# Patient Record
Sex: Male | Born: 1995 | Race: White | Hispanic: No | Marital: Single | Smoking: Never smoker
Health system: Southern US, Community
[De-identification: ages and names within clinical notes are randomized; demographics above are authoritative.]

## PROBLEM LIST (undated history)

## (undated) DIAGNOSIS — F1010 Alcohol abuse, uncomplicated: Secondary | ICD-10-CM

## (undated) HISTORY — PX: TONSILLECTOMY: SUR1361

## (undated) HISTORY — PX: DENTAL SURGERY: SHX609

---
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
## 2016-11-26 IMAGING — DX DG FINGER MIDDLE 2+V*R*
3 series · 3 of 3 positions shown · non-contrast
Comparison: None

CLINICAL DATA: Dropped weight on tip of right middle finger

EXAM:
RIGHT MIDDLE FINGER 2+V

[x finger pa right]
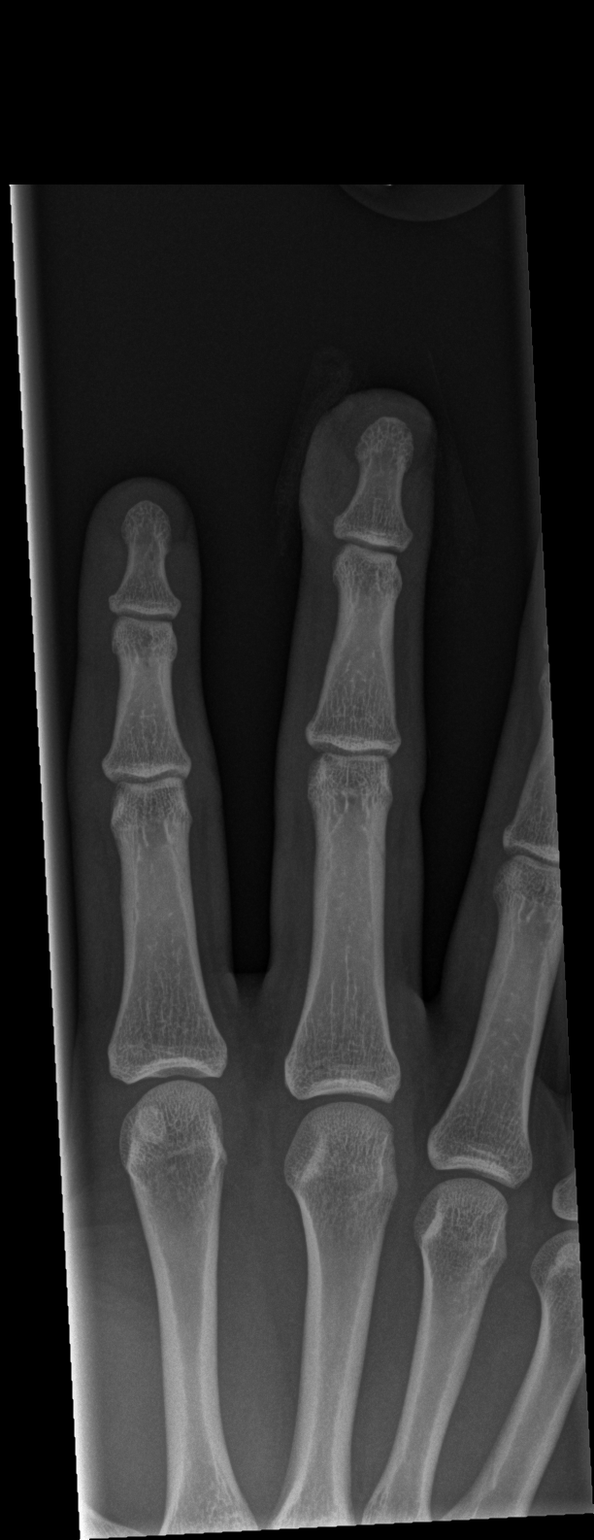

[x finger obl right]
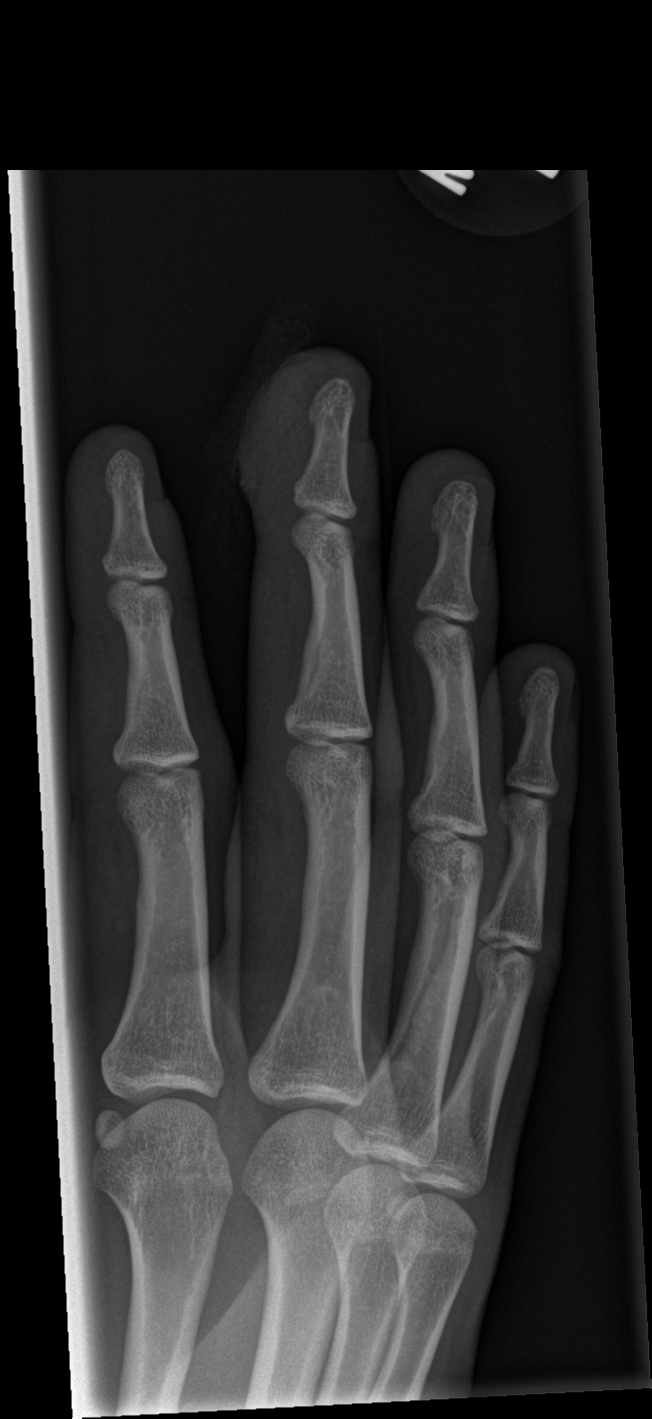

[x finger lat right]
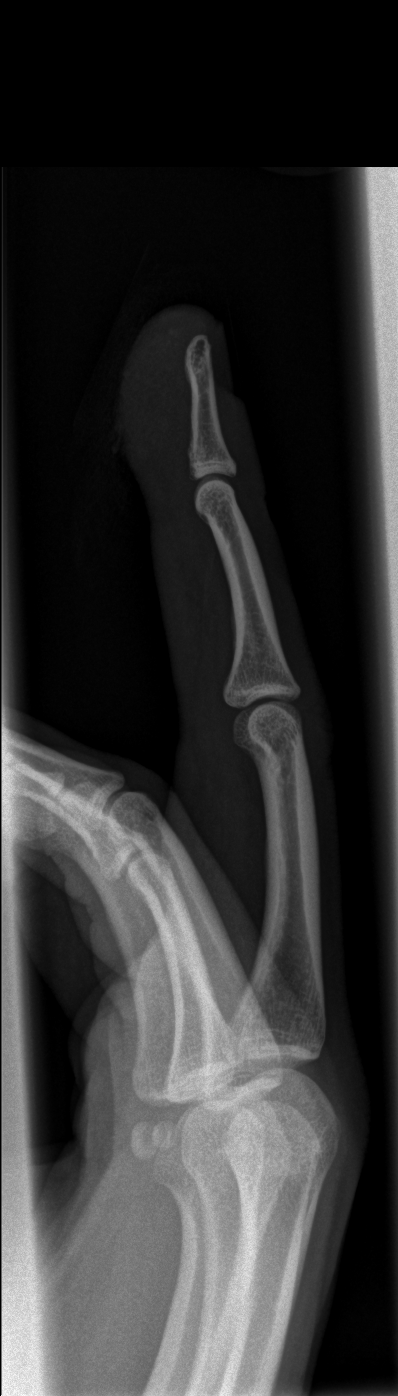

[3 of 3 positions shown; findings below may reference images not displayed]

FINDINGS: There is the soft tissue swelling overlying the distal phalanx. No
underlying fracture or subluxation identified no radiopaque foreign
bodies identified.
IMPRESSION: 1. Soft tissue swelling.
2. No acute bone abnormality.

## 2019-05-10 ENCOUNTER — Encounter (HOSPITAL_COMMUNITY): Payer: Self-pay | Admitting: *Deleted

## 2019-05-10 ENCOUNTER — Ambulatory Visit (HOSPITAL_COMMUNITY)
Admission: EM | Admit: 2019-05-10 | Discharge: 2019-05-10 | Disposition: A | Discharge status: Home / Self Care | Payer: 59 | Attending: Emergency Medicine | Admitting: Emergency Medicine

## 2019-05-10 ENCOUNTER — Other Ambulatory Visit: Payer: Self-pay

## 2019-05-10 DIAGNOSIS — R220 Localized swelling, mass and lump, head: Secondary | ICD-10-CM

## 2019-05-10 NOTE — ED Provider Notes (Signed)
[facility]    CSN: [id number] Arrival date & time: 05/10/19  1609      History   Chief Complaint Chief Complaint  Patient presents with  . Bumps on Tongue    HPI Mark Vargas is a 24 y.o. male.   Mark Vargas presents with complaints of concerns of bumps to the back of his tongue. Noted approximately 4 days ago. The following day the symptoms worsened but have not worsened since, have also not improved. Denies any previous similar. Has had some pain with swallowing although no difficulty with swallowing. Has had some congestion. Has noted that when he his clearing his nose he will occasionally spit out mucus that has some blood to it. No new foods or potential allergens he is aware of. No other rash elsewhere. No dental pain. He states he has had a male partner recently who maybe had some similar symptom, and whom he performed oral intercourse, inquiring if this could be STD.     ROS per HPI, negative if not otherwise mentioned.      History reviewed. No pertinent past medical history.  There are no problems to display for this patient.   Past Surgical History:  Procedure Laterality Date  . DENTAL SURGERY    . TONSILLECTOMY         Home Medications    Prior to Admission medications   Medication Sig Start Date End Date Taking? Authorizing Provider  cephALEXin (KEFLEX) 500 MG capsule Take 1 capsule (500 mg total) by mouth 4 (four) times daily. 03/29/15   Renfrow Lions, PA-C    Family History Family History  Problem Relation Age of Onset  . Healthy Mother   . Healthy Father     Social History Social History   Tobacco Use  . Smoking status: Never Smoker  . Smokeless tobacco: Never Used  Substance Use Topics  . Alcohol use: Yes    Comment: 3x weekly  . Drug use: Never     Allergies   Patient has no known allergies.   Review of Systems Review of Systems   Physical Exam Triage Vital Signs ED Triage Vitals  Enc  Vitals Group     BP 05/10/19 1635 (!) 157/63     Pulse Rate 05/10/19 1635 100     Resp 05/10/19 1635 18     Temp 05/10/19 1635 98.1 F (36.7 C)     Temp Source 05/10/19 1635 Oral     SpO2 05/10/19 1635 100 %     Weight --      Height --      Head Circumference --      Peak Flow --      Pain Score 05/10/19 1636 6     Pain Loc --      Pain Edu? --      Excl. in [place]? --    No data found.  Updated Vital Signs BP (!) 157/63   Pulse 100   Temp 98.1 F (36.7 C) (Oral)   Resp 18   SpO2 100%    Physical Exam Constitutional:      Appearance: He is well-developed.  HENT:     Head:     Jaw: No trismus.     Mouth/Throat:     Mouth: Mucous membranes are moist.     Dentition: Normal dentition. No dental tenderness.     Tongue: No lesions. Tongue does not deviate from midline.     Palate: No lesions.  Pharynx: Oropharynx is clear. No oropharyngeal exudate or uvula swelling.     Tonsils: 0 on the right. 0 on the left.     Comments: Distal right tongue is mildly swollen compared to left without visible abscess; no other soft palate redness or swelling; no lesions present; exam is somewhat limited due to patient tolerance / reflex  Cardiovascular:     Rate and Rhythm: Normal rate.  Pulmonary:     Effort: Pulmonary effort is normal.  Skin:    General: Skin is warm and dry.  Neurological:     Mental Status: He is alert and oriented to person, place, and time.      UC Treatments / Results  Labs (all labs ordered are listed, but only abnormal results are displayed) Labs Reviewed - No data to display  EKG   Radiology No results found.  Procedures Procedures (including critical care time)  Medications Ordered in UC Medications - No data to display  Initial Impression / Assessment and Plan / UC Course  I have reviewed the triage vital signs and the nursing notes.  Pertinent labs & imaging results that were available during my care of the patient were reviewed by me  and considered in my medical decision making (see chart for details).     No difficulty managing secretions or swallowing, no significant swelling. Afebrile. Has some congestion which at times can cause some blood with production of. Encouraged nasal saline to help promote moisture and prevent bleeding. No indication of epiglottitis. No indication of strep at this time. Offered oral decadron, patient would like to wait at this time. STD screening offered as well after discussion and patient will defer today. Return precautions provided. Patient verbalized understanding and agreeable to plan.   Final Clinical Impressions(s) / UC Diagnoses   Final diagnoses:  Mild tongue swelling     Discharge Instructions     I do not see any immediate red flag findings here today.  I think post nasal drip and your mild congestion is likely contributing to some of what you are experiencing.  You can try decongestants or antihistamines as needed to help with congestion.  Use of intranasal saline may help prevent any of the bleeding you occasional see.  If your symptoms do not improve or if they worsen- swelling, difficulty swallowing, fevers, pain, or new symptoms associated with this- please return to be seen for further evaluation.    ED Prescriptions    None     PDMP not reviewed this encounter.   Georgetta Haber, NP 05/10/19 [phone]

## 2019-05-10 NOTE — Discharge Instructions (Signed)
I do not see any immediate red flag findings here today.  I think post nasal drip and your mild congestion is likely contributing to some of what you are experiencing.  You can try decongestants or antihistamines as needed to help with congestion.  Use of intranasal saline may help prevent any of the bleeding you occasional see.  If your symptoms do not improve or if they worsen- swelling, difficulty swallowing, fevers, pain, or new symptoms associated with this- please return to be seen for further evaluation.

## 2019-05-10 NOTE — ED Triage Notes (Signed)
C/O painful bumps on back of tongue x 4 days.  Denies fevers or any other sxs.

## 2019-11-17 ENCOUNTER — Ambulatory Visit
Admission: EM | Admit: 2019-11-17 | Discharge: 2019-11-17 | Disposition: A | Discharge status: Home / Self Care | Payer: 59 | Attending: Physician Assistant | Admitting: Physician Assistant

## 2019-11-17 ENCOUNTER — Other Ambulatory Visit: Payer: Self-pay

## 2019-11-17 DIAGNOSIS — U071 COVID-19: Secondary | ICD-10-CM

## 2019-11-17 DIAGNOSIS — H66003 Acute suppurative otitis media without spontaneous rupture of ear drum, bilateral: Secondary | ICD-10-CM

## 2019-11-17 MED ORDER — AZELASTINE HCL 0.1 % NA SOLN: 2.0000 | Freq: Two times a day (BID) | NASAL | 0 refills | Status: DC

## 2019-11-17 MED ORDER — AMOXICILLIN-POT CLAVULANATE 875-125 MG PO TABS: 1.0000 | ORAL_TABLET | Freq: Two times a day (BID) | ORAL | 0 refills | Status: DC

## 2019-11-17 MED ORDER — BENZONATATE 200 MG PO CAPS: 200.0000 mg | ORAL_CAPSULE | Freq: Three times a day (TID) | ORAL | 0 refills | Status: DC

## 2019-11-17 NOTE — ED Provider Notes (Signed)
[facility]    CSN: [id number] Arrival date & time: 11/17/19  1643      History   Chief Complaint Chief Complaint  Patient presents with  . COVID + - cough    HPI Mark Vargas is a 24 y.o. male.   24 year old male comes in for 7 day of URI symptoms. Tested positive for COVID 4 days ago. Denies worsening of symptoms, new symptoms. Cough, nasal congestion, sore throat, loss of taste/smell, fatigue. Denies fever, chills, body aches. Denies abdominal pain, nausea, vomiting, diarrhea. Denies shortness of breath.      History reviewed. No pertinent past medical history.  There are no problems to display for this patient.   Past Surgical History:  Procedure Laterality Date  . DENTAL SURGERY    . TONSILLECTOMY         Home Medications    Prior to Admission medications   Medication Sig Start Date End Date Taking? Authorizing Provider  amoxicillin-clavulanate (AUGMENTIN) 875-125 MG tablet Take 1 tablet by mouth every 12 (twelve) hours. 11/17/19   Cathie Hoops, Pearson Picou V, PA-C  azelastine (ASTELIN) 0.1 % nasal spray Place 2 sprays into both nostrils 2 (two) times daily. 11/17/19   Cathie Hoops, Drew Herman V, PA-C  benzonatate (TESSALON) 200 MG capsule Take 1 capsule (200 mg total) by mouth every 8 (eight) hours. 11/17/19   Belinda Fisher, PA-C    Family History Family History  Problem Relation Age of Onset  . Healthy Mother   . Healthy Father     Social History Social History   Tobacco Use  . Smoking status: Never Smoker  . Smokeless tobacco: Never Used  Vaping Use  . Vaping Use: Never used  Substance Use Topics  . Alcohol use: Yes    Comment: 3x weekly  . Drug use: Never     Allergies   Patient has no known allergies.   Review of Systems Review of Systems  Reason unable to perform ROS: See HPI as above.     Physical Exam Triage Vital Signs ED Triage Vitals  Enc Vitals Group     BP 11/17/19 1742 136/89     Pulse Rate 11/17/19 1742 97     Resp 11/17/19 1742 18      Temp 11/17/19 1742 98.2 F (36.8 C)     Temp Source 11/17/19 1742 Oral     SpO2 11/17/19 1742 97 %     Weight --      Height --      Head Circumference --      Peak Flow --      Pain Score 11/17/19 1807 5     Pain Loc --      Pain Edu? --      Excl. in [place]? --    No data found.  Updated Vital Signs BP 136/89 (BP Location: Left Arm)   Pulse 97   Temp 98.2 F (36.8 C) (Oral)   Resp 18   SpO2 97%   Physical Exam Constitutional:      General: He is not in acute distress.    Appearance: He is well-developed. He is not ill-appearing, toxic-appearing or diaphoretic.  HENT:     Head: Normocephalic and atraumatic.     Right Ear: Ear canal and external ear normal. Tympanic membrane is erythematous and bulging.     Left Ear: Ear canal and external ear normal. Tympanic membrane is erythematous and bulging.     Nose:     Right  Sinus: No maxillary sinus tenderness or frontal sinus tenderness.     Left Sinus: No maxillary sinus tenderness or frontal sinus tenderness.     Mouth/Throat:     Mouth: Mucous membranes are moist.     Pharynx: Oropharynx is clear. Uvula midline.  Eyes:     Conjunctiva/sclera: Conjunctivae normal.     Pupils: Pupils are equal, round, and reactive to light.  Cardiovascular:     Rate and Rhythm: Normal rate and regular rhythm.  Pulmonary:     Effort: Pulmonary effort is normal. No accessory muscle usage, prolonged expiration, respiratory distress or retractions.     Breath sounds: No decreased air movement or transmitted upper airway sounds. No decreased breath sounds.     Comments: LCTAB Musculoskeletal:     Cervical back: Normal range of motion and neck supple.  Skin:    General: Skin is warm and dry.  Neurological:     Mental Status: He is alert and oriented to person, place, and time.    UC Treatments / Results  Labs (all labs ordered are listed, but only abnormal results are displayed) Labs Reviewed - No data to display  EKG   Radiology No  results found.  Procedures Procedures (including critical care time)  Medications Ordered in UC Medications - No data to display  Initial Impression / Assessment and Plan / UC Course  I have reviewed the triage vital signs and the nursing notes.  Pertinent labs & imaging results that were available during my care of the patient were reviewed by me and considered in my medical decision making (see chart for details).    No alarming signs on exam. Patient afebrile, nontoxic. Stable vitals. LCTAB. Bilateral TM erythematous, bulging, patient complaints of left ear pain, will cover for otitis media with augmentin. Discussed other symptomatic treatment. Return precautions given. Patient expresses understanding and agrees to plan.  Final Clinical Impressions(s) / UC Diagnoses   Final diagnoses:  COVID-19  Non-recurrent acute suppurative otitis media of both ears without spontaneous rupture of tympanic membranes    ED Prescriptions    Medication Sig Dispense Auth. Provider   amoxicillin-clavulanate (AUGMENTIN) 875-125 MG tablet Take 1 tablet by mouth every 12 (twelve) hours. 14 tablet Mieke Brinley V, PA-C   azelastine (ASTELIN) 0.1 % nasal spray Place 2 sprays into both nostrils 2 (two) times daily. 30 mL Jonavin Seder V, PA-C   benzonatate (TESSALON) 200 MG capsule Take 1 capsule (200 mg total) by mouth every 8 (eight) hours. 21 capsule Belinda Fisher, PA-C     PDMP not reviewed this encounter.   Belinda Fisher, PA-C 11/17/19 1853

## 2019-11-17 NOTE — Discharge Instructions (Addendum)
No alarming signs on exam. Your vitals were good, and your lungs were clear. Both ears were red and swollen. We will treat for ear infection with augmentin, which will also cover for a bacterial sinus infection. However, as discussed, these are all symptoms consistent with COVID and can continue for the next few days-weeks. Tessalon for cough. Azelastine nasal spray for congestion/drainage. If having shortness of breath, chest pain, go to the emergency department for further evaluation.

## 2019-11-17 NOTE — ED Triage Notes (Signed)
Pt c/o cough, congestion, sore throat, loss of taste/smell, and fatigue x7days. States covid positive 4 days ago. Denies SOB.

## 2019-12-27 ENCOUNTER — Other Ambulatory Visit: Payer: Self-pay

## 2019-12-27 ENCOUNTER — Ambulatory Visit: Admission: EM | Admit: 2019-12-27 | Discharge: 2019-12-27 | Discharge status: Against Medical Advice | Payer: 59

## 2019-12-31 ENCOUNTER — Other Ambulatory Visit: Payer: Self-pay

## 2019-12-31 ENCOUNTER — Ambulatory Visit
Admission: EM | Admit: 2019-12-31 | Discharge: 2019-12-31 | Disposition: A | Discharge status: Home / Self Care | Payer: 59 | Attending: Emergency Medicine | Admitting: Emergency Medicine

## 2019-12-31 DIAGNOSIS — R360 Urethral discharge without blood: Secondary | ICD-10-CM | POA: Insufficient documentation

## 2019-12-31 NOTE — Discharge Instructions (Addendum)

## 2019-12-31 NOTE — ED Triage Notes (Signed)
Pt states he has had peeling skin on his penis as well as a discharge for approximately 9 days. Pt states no painful urination or frequency. Pt is aox4 and ambulatory.

## 2019-12-31 NOTE — ED Provider Notes (Signed)
[facility]    CSN: [id number] Arrival date & time: 12/31/19  1603      History   Chief Complaint Chief Complaint  Patient presents with  . SEXUALLY TRANSMITTED DISEASE    sxs x 9 days, discharge    HPI Mark Vargas is a 24 y.o. male  Presenting for STI check.  Endorsing meatal irritation and penile discharge x9 days.  No painful urination, urinary frequency, testicular pain or swelling, abdominal pain, back pain, fever.  No nausea or vomiting.  History reviewed. No pertinent past medical history.  There are no problems to display for this patient.   Past Surgical History:  Procedure Laterality Date  . DENTAL SURGERY    . TONSILLECTOMY         Home Medications    Prior to Admission medications   Medication Sig Start Date End Date Taking? Authorizing Provider  azelastine (ASTELIN) 0.1 % nasal spray Place 2 sprays into both nostrils 2 (two) times daily. 11/17/19 12/31/19  Belinda Fisher, PA-C    Family History Family History  Problem Relation Age of Onset  . Healthy Mother   . Healthy Father     Social History Social History   Tobacco Use  . Smoking status: Never Smoker  . Smokeless tobacco: Never Used  Vaping Use  . Vaping Use: Never used  Substance Use Topics  . Alcohol use: Yes    Comment: 3x weekly  . Drug use: Never     Allergies   Patient has no known allergies.   Review of Systems As per HPI   Physical Exam Triage Vital Signs ED Triage Vitals  Enc Vitals Group     BP      Pulse      Resp      Temp      Temp src      SpO2      Weight      Height      Head Circumference      Peak Flow      Pain Score      Pain Loc      Pain Edu?      Excl. in [place]?    No data found.  Updated Vital Signs BP (!) 153/81 (BP Location: Right Arm)   Pulse 100   Temp 98.4 F (36.9 C) (Oral)   Resp 18   SpO2 99%   Visual Acuity Right Eye Distance:   Left Eye Distance:   Bilateral Distance:    Right Eye Near:   Left Eye  Near:    Bilateral Near:     Physical Exam Exam conducted with a chaperone present.  Constitutional:      General: He is not in acute distress. HENT:     Head: Normocephalic and atraumatic.  Eyes:     General: No scleral icterus.    Pupils: Pupils are equal, round, and reactive to light.  Cardiovascular:     Rate and Rhythm: Normal rate.  Pulmonary:     Effort: Pulmonary effort is normal. No respiratory distress.     Breath sounds: No wheezing.  Genitourinary:    Penis: Normal.      Testes: Normal.  Skin:    Coloration: Skin is not jaundiced or pale.  Neurological:     Mental Status: He is alert and oriented to person, place, and time.      UC Treatments / Results  Labs (all labs ordered are listed, but only abnormal  results are displayed) Labs Reviewed  CYTOLOGY, (ORAL, ANAL, URETHRAL) ANCILLARY ONLY    EKG   Radiology No results found.  Procedures Procedures (including critical care time)  Medications Ordered in UC Medications - No data to display  Initial Impression / Assessment and Plan / UC Course  I have reviewed the triage vital signs and the nursing notes.  Pertinent labs & imaging results that were available during my care of the patient were reviewed by me and considered in my medical decision making (see chart for details).     Cytology pending.  Patient wanting to wait until results are back prior to treatment.  Return precautions discussed, pt verbalized understanding and is agreeable to plan. Final Clinical Impressions(s) / UC Diagnoses   Final diagnoses:  Penile discharge, without blood     Discharge Instructions     Testing for chlamydia, gonorrhea, trichomonas is pending: please look for these results on the MyChart app/website.  We will notify you if you are positive and outline treatment at that time.  Important to avoid all forms of sexual intercourse (oral, vaginal, anal) with any/all partners for the next 7 days to avoid  spreading/reinfecting. Any/all sexual partners should be notified of testing/treatment today.  Return for persistent/worsening symptoms or if you develop fever, abdominal or pelvic pain, discharge, genital pain, blood in your urine, or are re-exposed to an STI.    ED Prescriptions    None     PDMP not reviewed this encounter.   Hall[place], [place], [place] 12/31/19 1658

## 2020-01-01 LAB — CYTOLOGY, (ORAL, ANAL, URETHRAL) ANCILLARY ONLY
Chlamydia: POSITIVE — AB
Comment: NEGATIVE
Comment: NEGATIVE
Comment: NORMAL
Neisseria Gonorrhea: NEGATIVE
Trichomonas: NEGATIVE

## 2020-01-02 ENCOUNTER — Telehealth (HOSPITAL_COMMUNITY): Payer: Self-pay | Admitting: Emergency Medicine

## 2020-01-02 MED ORDER — AZITHROMYCIN 250 MG PO TABS: 1000.0000 mg | ORAL_TABLET | Freq: Once | ORAL | 0 refills | Status: AC

## 2022-06-05 ENCOUNTER — Ambulatory Visit
Admission: RE | Admit: 2022-06-05 | Discharge: 2022-06-05 | Disposition: A | Discharge status: Home / Self Care | Payer: 59 | Source: Ambulatory Visit | Attending: Internal Medicine | Admitting: Internal Medicine

## 2022-06-05 VITALS — BP 152/90 | HR 94 | Temp 98.0°F | Resp 14

## 2022-06-05 DIAGNOSIS — B349 Viral infection, unspecified: Secondary | ICD-10-CM | POA: Diagnosis not present

## 2022-06-05 DIAGNOSIS — R197 Diarrhea, unspecified: Secondary | ICD-10-CM

## 2022-06-05 DIAGNOSIS — R112 Nausea with vomiting, unspecified: Secondary | ICD-10-CM

## 2022-06-05 MED ORDER — ONDANSETRON 4 MG PO TBDP: 4.0000 mg | ORAL_TABLET | Freq: Three times a day (TID) | ORAL | 0 refills | Status: DC | PRN

## 2022-06-05 NOTE — ED Triage Notes (Signed)
Pt presents with abdominal pain and emesis x 7 days.

## 2022-06-05 NOTE — ED Provider Notes (Signed)
[facility]    CSN: [id number] Arrival date & time: 06/05/22  1747      History   Chief Complaint Chief Complaint  Patient presents with   Abdominal Pain    Entered by patient   Emesis    HPI Mark Vargas is a 27 y.o. male.   Patient presents with nausea, vomiting, diarrhea that started about 7 days ago.  Patient denies blood in stool or emesis.  Patient is having approximately 3-4 episodes of vomiting a day.  Diarrhea is minimal.  Patient reports minimal abdominal pain on the left side that is intermittent.  Denies any associated fever, cough, upper respiratory symptoms.  Denies any known sick contacts, recent unfavorable foods, travel outside the [place].  Patient has been able to tolerate food and fluids.   Abdominal Pain Emesis   History reviewed. No pertinent past medical history.  There are no problems to display for this patient.   Past Surgical History:  Procedure Laterality Date   DENTAL SURGERY     TONSILLECTOMY         Home Medications    Prior to Admission medications   Medication Sig Start Date End Date Taking? Authorizing Provider  ondansetron (ZOFRAN-ODT) 4 MG disintegrating tablet Take 1 tablet (4 mg total) by mouth every 8 (eight) hours as needed for nausea or vomiting. 06/05/22  Yes Ondra Deboard, Hildred Alamin E, FNP  azelastine (ASTELIN) 0.1 % nasal spray Place 2 sprays into both nostrils 2 (two) times daily. 11/17/19 12/31/19  Ok Edwards, PA-C    Family History Family History  Problem Relation Age of Onset   Healthy Mother    Healthy Father     Social History Social History   Tobacco Use   Smoking status: Never   Smokeless tobacco: Never  Vaping Use   Vaping Use: Never used  Substance Use Topics   Alcohol use: Yes    Comment: 3x weekly   Drug use: Never     Allergies   Patient has no known allergies.   Review of Systems Review of Systems Per HPI  Physical Exam Triage Vital Signs ED Triage Vitals [06/05/22 1808]   Enc Vitals Group     BP (!) 152/90     Pulse Rate 94     Resp 14     Temp 98 F (36.7 C)     Temp Source Oral     SpO2 96 %     Weight      Height      Head Circumference      Peak Flow      Pain Score 2     Pain Loc      Pain Edu?      Excl. in [place]?    No data found.  Updated Vital Signs BP (!) 152/90 (BP Location: Left Arm)   Pulse 94   Temp 98 F (36.7 C) (Oral)   Resp 14   SpO2 96%   Visual Acuity Right Eye Distance:   Left Eye Distance:   Bilateral Distance:    Right Eye Near:   Left Eye Near:    Bilateral Near:     Physical Exam Constitutional:      General: He is not in acute distress.    Appearance: Normal appearance. He is not toxic-appearing or diaphoretic.  HENT:     Head: Normocephalic and atraumatic.     Mouth/Throat:     Mouth: Mucous membranes are moist.  Pharynx: No posterior oropharyngeal erythema.  Eyes:     Extraocular Movements: Extraocular movements intact.     Conjunctiva/sclera: Conjunctivae normal.  Cardiovascular:     Rate and Rhythm: Normal rate and regular rhythm.     Pulses: Normal pulses.     Heart sounds: Normal heart sounds.  Pulmonary:     Effort: Pulmonary effort is normal. No respiratory distress.     Breath sounds: Normal breath sounds.  Abdominal:     General: Bowel sounds are normal. There is no distension.     Palpations: Abdomen is soft.     Tenderness: There is no abdominal tenderness.  Neurological:     General: No focal deficit present.     Mental Status: He is alert and oriented to person, place, and time. Mental status is at baseline.  Psychiatric:        Mood and Affect: Mood normal.        Behavior: Behavior normal.        Thought Content: Thought content normal.        Judgment: Judgment normal.      UC Treatments / Results  Labs (all labs ordered are listed, but only abnormal results are displayed) Labs Reviewed  CBC  COMPREHENSIVE METABOLIC PANEL    EKG   Radiology No results  found.  Procedures Procedures (including critical care time)  Medications Ordered in UC Medications - No data to display  Initial Impression / Assessment and Plan / UC Course  I have reviewed the triage vital signs and the nursing notes.  Pertinent labs & imaging results that were available during my care of the patient were reviewed by me and considered in my medical decision making (see chart for details).     Suspect viral cause to patient's symptoms.  There are no signs of acute abdomen or dehydration on exam.  Although, given duration of symptoms did offer patient IV fluids but he declined this with shared decision making.  Will prescribe ondansetron to take as needed for nausea and advised patient to oral fluids.  Advised bland diet for diarrhea.  Will obtain CMP and CBC.  Patient advised to go to the ER if any symptoms persist or worsen.  Patient verbalized understanding and was agreeable with plan. Final Clinical Impressions(s) / UC Diagnoses   Final diagnoses:  Viral illness  Nausea vomiting and diarrhea     Discharge Instructions      I have prescribed you a nausea medication to take as needed.  Ensure adequate fluid hydration and bland diet.  Blood work is pending.  Will call if it is abnormal.  Follow-up with the ER if any symptoms persist or worsen.    ED Prescriptions     Medication Sig Dispense Auth. Provider   ondansetron (ZOFRAN-ODT) 4 MG disintegrating tablet Take 1 tablet (4 mg total) by mouth every 8 (eight) hours as needed for nausea or vomiting. 20 tablet Zeigler, Michele Rockers, [place]      PDMP not reviewed this encounter.   Teodora Medici, [place] 06/05/22 [phone]

## 2022-06-05 NOTE — Discharge Instructions (Signed)
I have prescribed you a nausea medication to take as needed.  Ensure adequate fluid hydration and bland diet.  Blood work is pending.  Will call if it is abnormal.  Follow-up with the ER if any symptoms persist or worsen.

## 2022-06-07 LAB — COMPREHENSIVE METABOLIC PANEL
ALT: 252 IU/L — ABNORMAL HIGH (ref 0–44)
AST: 161 IU/L — ABNORMAL HIGH (ref 0–40)
Albumin/Globulin Ratio: 1.7 (ref 1.2–2.2)
Albumin: 5 g/dL (ref 4.3–5.2)
Alkaline Phosphatase: 102 IU/L (ref 44–121)
BUN/Creatinine Ratio: 11 (ref 9–20)
BUN: 9 mg/dL (ref 6–20)
Bilirubin Total: 0.6 mg/dL (ref 0.0–1.2)
CO2: 21 mmol/L (ref 20–29)
Calcium: 10.1 mg/dL (ref 8.7–10.2)
Chloride: 99 mmol/L (ref 96–106)
Creatinine, Ser: 0.83 mg/dL (ref 0.76–1.27)
Globulin, Total: 2.9 g/dL (ref 1.5–4.5)
Glucose: 93 mg/dL (ref 70–99)
Potassium: 4 mmol/L (ref 3.5–5.2)
Sodium: 139 mmol/L (ref 134–144)
Total Protein: 7.9 g/dL (ref 6.0–8.5)
eGFR: 125 mL/min/{1.73_m2} (ref >59)

## 2022-06-07 LAB — CBC
Hematocrit: 50.2 % (ref 37.5–51.0)
Hemoglobin: 16.2 g/dL (ref 13.0–17.7)
MCH: 29.8 pg (ref 26.6–33.0)
MCHC: 32.3 g/dL (ref 31.5–35.7)
MCV: 92 fL (ref 79–97)
Platelets: 252 10*3/uL (ref 150–450)
RBC: 5.44 x10E6/uL (ref 4.14–5.80)
RDW: 12.4 % (ref 11.6–15.4)
WBC: 7.8 10*3/uL (ref 3.4–10.8)

## 2023-02-06 ENCOUNTER — Inpatient Hospital Stay (HOSPITAL_COMMUNITY)
Admission: EM | Admit: 2023-02-06 | Discharge: 2023-02-12 | DRG: 897 | Disposition: A | Discharge status: Home / Self Care | Payer: BC Managed Care – PPO | Attending: Internal Medicine | Admitting: Internal Medicine

## 2023-02-06 ENCOUNTER — Other Ambulatory Visit: Payer: Self-pay

## 2023-02-06 ENCOUNTER — Encounter (HOSPITAL_COMMUNITY): Payer: Self-pay

## 2023-02-06 DIAGNOSIS — F10129 Alcohol abuse with intoxication, unspecified: Secondary | ICD-10-CM | POA: Diagnosis present

## 2023-02-06 DIAGNOSIS — Z79899 Other long term (current) drug therapy: Secondary | ICD-10-CM

## 2023-02-06 DIAGNOSIS — F10139 Alcohol abuse with withdrawal, unspecified: Principal | ICD-10-CM | POA: Diagnosis present

## 2023-02-06 DIAGNOSIS — F1010 Alcohol abuse, uncomplicated: Secondary | ICD-10-CM | POA: Diagnosis present

## 2023-02-06 DIAGNOSIS — E876 Hypokalemia: Secondary | ICD-10-CM | POA: Diagnosis present

## 2023-02-06 DIAGNOSIS — Z6841 Body Mass Index (BMI) 40.0 and over, adult: Secondary | ICD-10-CM

## 2023-02-06 DIAGNOSIS — F10939 Alcohol use, unspecified with withdrawal, unspecified: Secondary | ICD-10-CM | POA: Diagnosis present

## 2023-02-06 DIAGNOSIS — F419 Anxiety disorder, unspecified: Secondary | ICD-10-CM | POA: Diagnosis present

## 2023-02-06 DIAGNOSIS — K7010 Alcoholic hepatitis without ascites: Secondary | ICD-10-CM | POA: Diagnosis present

## 2023-02-06 DIAGNOSIS — E8729 Other acidosis: Secondary | ICD-10-CM | POA: Diagnosis present

## 2023-02-06 DIAGNOSIS — D6959 Other secondary thrombocytopenia: Secondary | ICD-10-CM | POA: Diagnosis present

## 2023-02-06 DIAGNOSIS — F10930 Alcohol use, unspecified with withdrawal, uncomplicated: Principal | ICD-10-CM

## 2023-02-06 DIAGNOSIS — R7401 Elevation of levels of liver transaminase levels: Secondary | ICD-10-CM | POA: Diagnosis present

## 2023-02-06 DIAGNOSIS — E872 Acidosis, unspecified: Secondary | ICD-10-CM | POA: Diagnosis present

## 2023-02-06 DIAGNOSIS — D696 Thrombocytopenia, unspecified: Secondary | ICD-10-CM | POA: Diagnosis present

## 2023-02-06 DIAGNOSIS — Y902 Blood alcohol level of 40-59 mg/100 ml: Secondary | ICD-10-CM | POA: Diagnosis present

## 2023-02-06 HISTORY — DX: Alcohol abuse, uncomplicated: F10.10

## 2023-02-06 MED ORDER — THIAMINE HCL 100 MG/ML IJ SOLN: 100.0000 mg | Freq: Every day | INTRAMUSCULAR | Status: DC

## 2023-02-06 MED ORDER — LORAZEPAM 2 MG/ML IJ SOLN: 0.0000 mg | Freq: Two times a day (BID) | INTRAMUSCULAR | Status: DC

## 2023-02-06 MED ORDER — LORAZEPAM 1 MG PO TABS: 0.0000 mg | ORAL_TABLET | Freq: Four times a day (QID) | ORAL | Status: DC

## 2023-02-06 MED ORDER — LORAZEPAM 1 MG PO TABS: 0.0000 mg | ORAL_TABLET | Freq: Two times a day (BID) | ORAL | Status: DC

## 2023-02-06 MED ORDER — LORAZEPAM 2 MG/ML IJ SOLN
0.0000 mg | Freq: Four times a day (QID) | INTRAMUSCULAR | Status: DC
  Administered 2023-02-07: 2 mg via INTRAVENOUS
  Filled 2023-02-06 (×2): qty 1

## 2023-02-06 MED ORDER — THIAMINE MONONITRATE 100 MG PO TABS: 100.0000 mg | ORAL_TABLET | Freq: Every day | ORAL | Status: DC

## 2023-02-06 NOTE — ED Triage Notes (Signed)
Arrives [facility] from [facility] for alcohol detox. Visible tremors in triage.   Usually drinks 15 white claws a day but has had 3.   [facility] administered Librium 50mg  @ 6PM  Valium IM 10mg  @ 6PM, 9PM

## 2023-02-07 ENCOUNTER — Encounter (HOSPITAL_COMMUNITY): Payer: Self-pay | Admitting: Internal Medicine

## 2023-02-07 DIAGNOSIS — Z6841 Body Mass Index (BMI) 40.0 and over, adult: Secondary | ICD-10-CM | POA: Diagnosis not present

## 2023-02-07 DIAGNOSIS — D6959 Other secondary thrombocytopenia: Secondary | ICD-10-CM | POA: Diagnosis present

## 2023-02-07 DIAGNOSIS — F10939 Alcohol use, unspecified with withdrawal, unspecified: Secondary | ICD-10-CM | POA: Diagnosis present

## 2023-02-07 DIAGNOSIS — F10139 Alcohol abuse with withdrawal, unspecified: Secondary | ICD-10-CM | POA: Diagnosis present

## 2023-02-07 DIAGNOSIS — E876 Hypokalemia: Secondary | ICD-10-CM | POA: Diagnosis present

## 2023-02-07 DIAGNOSIS — D696 Thrombocytopenia, unspecified: Secondary | ICD-10-CM | POA: Diagnosis present

## 2023-02-07 DIAGNOSIS — K7010 Alcoholic hepatitis without ascites: Secondary | ICD-10-CM | POA: Diagnosis present

## 2023-02-07 DIAGNOSIS — F419 Anxiety disorder, unspecified: Secondary | ICD-10-CM | POA: Diagnosis present

## 2023-02-07 DIAGNOSIS — Y902 Blood alcohol level of 40-59 mg/100 ml: Secondary | ICD-10-CM | POA: Diagnosis present

## 2023-02-07 DIAGNOSIS — F10129 Alcohol abuse with intoxication, unspecified: Secondary | ICD-10-CM | POA: Diagnosis present

## 2023-02-07 DIAGNOSIS — Z79899 Other long term (current) drug therapy: Secondary | ICD-10-CM | POA: Diagnosis not present

## 2023-02-07 DIAGNOSIS — E8729 Other acidosis: Secondary | ICD-10-CM | POA: Diagnosis present

## 2023-02-07 DIAGNOSIS — F1010 Alcohol abuse, uncomplicated: Secondary | ICD-10-CM | POA: Diagnosis not present

## 2023-02-07 DIAGNOSIS — E872 Acidosis, unspecified: Secondary | ICD-10-CM | POA: Diagnosis present

## 2023-02-07 DIAGNOSIS — R7401 Elevation of levels of liver transaminase levels: Secondary | ICD-10-CM | POA: Diagnosis present

## 2023-02-07 LAB — CBC WITH DIFFERENTIAL/PLATELET
Abs Immature Granulocytes: 0.01 10*3/uL (ref 0.00–0.07)
Abs Immature Granulocytes: 0.02 10*3/uL (ref 0.00–0.07)
Basophils Absolute: 0.1 10*3/uL (ref 0.0–0.1)
Basophils Absolute: 0.1 10*3/uL (ref 0.0–0.1)
Basophils Relative: 1 %
Basophils Relative: 1 %
Eosinophils Absolute: 0.1 10*3/uL (ref 0.0–0.5)
Eosinophils Absolute: 0.1 10*3/uL (ref 0.0–0.5)
Eosinophils Relative: 2 %
Eosinophils Relative: 3 %
HCT: 43 % (ref 39.0–52.0)
HCT: 42 % (ref 39.0–52.0)
Hemoglobin: 15 g/dL (ref 13.0–17.0)
Hemoglobin: 14.3 g/dL (ref 13.0–17.0)
Immature Granulocytes: 0 %
Immature Granulocytes: 0 %
Lymphocytes Relative: 11 %
Lymphocytes Relative: 11 %
Lymphs Abs: 0.5 10*3/uL — ABNORMAL LOW (ref 0.7–4.0)
Lymphs Abs: 0.5 10*3/uL — ABNORMAL LOW (ref 0.7–4.0)
MCH: 31.4 pg (ref 26.0–34.0)
MCH: 31.4 pg (ref 26.0–34.0)
MCHC: 34.9 g/dL (ref 30.0–36.0)
MCHC: 34 g/dL (ref 30.0–36.0)
MCV: 90.1 fL (ref 80.0–100.0)
MCV: 92.1 fL (ref 80.0–100.0)
Monocytes Absolute: 0.7 10*3/uL (ref 0.1–1.0)
Monocytes Absolute: 0.7 10*3/uL (ref 0.1–1.0)
Monocytes Relative: 15 %
Monocytes Relative: 14 %
Neutro Abs: 3.2 10*3/uL (ref 1.7–7.7)
Neutro Abs: 3.4 10*3/uL (ref 1.7–7.7)
Neutrophils Relative %: 71 %
Neutrophils Relative %: 71 %
Platelets: 70 10*3/uL — ABNORMAL LOW (ref 150–400)
Platelets: 67 10*3/uL — ABNORMAL LOW (ref 150–400)
RBC: 4.77 MIL/uL (ref 4.22–5.81)
RBC: 4.56 MIL/uL (ref 4.22–5.81)
RDW: 12.3 % (ref 11.5–15.5)
RDW: 12.4 % (ref 11.5–15.5)
WBC: 4.5 10*3/uL (ref 4.0–10.5)
WBC: 4.8 10*3/uL (ref 4.0–10.5)
nRBC: 0 % (ref 0.0–0.2)
nRBC: 0 % (ref 0.0–0.2)

## 2023-02-07 LAB — COMPREHENSIVE METABOLIC PANEL
ALT: 331 U/L — ABNORMAL HIGH (ref 0–44)
ALT: 298 U/L — ABNORMAL HIGH (ref 0–44)
AST: 524 U/L — ABNORMAL HIGH (ref 15–41)
AST: 493 U/L — ABNORMAL HIGH (ref 15–41)
Albumin: 4.3 g/dL (ref 3.5–5.0)
Albumin: 4 g/dL (ref 3.5–5.0)
Alkaline Phosphatase: 108 U/L (ref 38–126)
Alkaline Phosphatase: 94 U/L (ref 38–126)
Anion gap: 19 — ABNORMAL HIGH (ref 5–15)
Anion gap: 10 (ref 5–15)
BUN: <5 mg/dL — ABNORMAL LOW (ref 6–20)
BUN: 5 mg/dL — ABNORMAL LOW (ref 6–20)
CO2: 21 mmol/L — ABNORMAL LOW (ref 22–32)
CO2: 25 mmol/L (ref 22–32)
Calcium: 9.9 mg/dL (ref 8.9–10.3)
Calcium: 9.8 mg/dL (ref 8.9–10.3)
Chloride: 96 mmol/L — ABNORMAL LOW (ref 98–111)
Chloride: 101 mmol/L (ref 98–111)
Creatinine, Ser: 0.63 mg/dL (ref 0.61–1.24)
Creatinine, Ser: 0.8 mg/dL (ref 0.61–1.24)
GFR, Estimated: >60 mL/min (ref >60)
GFR, Estimated: >60 mL/min (ref >60)
Glucose, Bld: 103 mg/dL — ABNORMAL HIGH (ref 70–99)
Glucose, Bld: 116 mg/dL — ABNORMAL HIGH (ref 70–99)
Potassium: 3.4 mmol/L — ABNORMAL LOW (ref 3.5–5.1)
Potassium: 3.5 mmol/L (ref 3.5–5.1)
Sodium: 136 mmol/L (ref 135–145)
Sodium: 136 mmol/L (ref 135–145)
Total Bilirubin: 1.8 mg/dL — ABNORMAL HIGH (ref <1.2)
Total Bilirubin: 2.1 mg/dL — ABNORMAL HIGH (ref <1.2)
Total Protein: 8.2 g/dL — ABNORMAL HIGH (ref 6.5–8.1)
Total Protein: 7.8 g/dL (ref 6.5–8.1)

## 2023-02-07 LAB — PROTIME-INR
INR: 1.1 (ref 0.8–1.2)
Prothrombin Time: 14 s (ref 11.4–15.2)

## 2023-02-07 LAB — PHOSPHORUS: Phosphorus: 3.3 mg/dL (ref 2.5–4.6)

## 2023-02-07 LAB — APTT: aPTT: 28 s (ref 24–36)

## 2023-02-07 LAB — BILIRUBIN, DIRECT: Bilirubin, Direct: 0.8 mg/dL — ABNORMAL HIGH (ref 0.0–0.2)

## 2023-02-07 LAB — LIPASE, BLOOD: Lipase: 162 U/L — ABNORMAL HIGH (ref 11–51)

## 2023-02-07 LAB — CK: Total CK: 190 U/L (ref 49–397)

## 2023-02-07 LAB — MAGNESIUM: Magnesium: 1.7 mg/dL (ref 1.7–2.4)

## 2023-02-07 LAB — ETHANOL: Alcohol, Ethyl (B): 40 mg/dL — ABNORMAL HIGH (ref <10)

## 2023-02-07 MED ORDER — ADULT MULTIVITAMIN W/MINERALS CH
1.0000 | ORAL_TABLET | Freq: Every day | ORAL | Status: DC
  Administered 2023-02-07 – 2023-02-12 (×6): 1 via ORAL
  Filled 2023-02-07 (×6): qty 1

## 2023-02-07 MED ORDER — LORAZEPAM 2 MG/ML IJ SOLN
INTRAMUSCULAR | Status: AC
  Administered 2023-02-07: 3 mg via INTRAVENOUS
  Filled 2023-02-07: qty 2

## 2023-02-07 MED ORDER — THIAMINE HCL 100 MG/ML IJ SOLN
100.0000 mg | Freq: Every day | INTRAMUSCULAR | Status: DC
  Filled 2023-02-07: qty 2

## 2023-02-07 MED ORDER — ACETAMINOPHEN 325 MG PO TABS: 650.0000 mg | ORAL_TABLET | Freq: Four times a day (QID) | ORAL | Status: DC | PRN

## 2023-02-07 MED ORDER — POTASSIUM CHLORIDE CRYS ER 20 MEQ PO TBCR
40.0000 meq | EXTENDED_RELEASE_TABLET | Freq: Once | ORAL | Status: DC
  Filled 2023-02-07: qty 2

## 2023-02-07 MED ORDER — THIAMINE MONONITRATE 100 MG PO TABS
100.0000 mg | ORAL_TABLET | Freq: Every day | ORAL | Status: DC
  Administered 2023-02-07 – 2023-02-12 (×6): 100 mg via ORAL
  Filled 2023-02-07 (×6): qty 1

## 2023-02-07 MED ORDER — ONDANSETRON HCL 4 MG/2ML IJ SOLN
4.0000 mg | Freq: Four times a day (QID) | INTRAMUSCULAR | Status: DC | PRN
  Administered 2023-02-09: 4 mg via INTRAVENOUS
  Filled 2023-02-07: qty 2

## 2023-02-07 MED ORDER — FOLIC ACID 1 MG PO TABS
1.0000 mg | ORAL_TABLET | Freq: Every day | ORAL | Status: DC
  Administered 2023-02-08 – 2023-02-12 (×5): 1 mg via ORAL
  Filled 2023-02-07 (×5): qty 1

## 2023-02-07 MED ORDER — LORAZEPAM 2 MG/ML IJ SOLN
1.0000 mg | INTRAMUSCULAR | Status: DC | PRN
  Administered 2023-02-07: 2 mg via INTRAVENOUS
  Administered 2023-02-07: 3 mg via INTRAVENOUS
  Administered 2023-02-07 (×2): 2 mg via INTRAVENOUS
  Administered 2023-02-07: 1 mg via INTRAVENOUS
  Administered 2023-02-07 – 2023-02-08 (×6): 2 mg via INTRAVENOUS
  Filled 2023-02-07 (×3): qty 1
  Filled 2023-02-07: qty 2
  Filled 2023-02-07 (×7): qty 1

## 2023-02-07 MED ORDER — LORAZEPAM 1 MG PO TABS: 1.0000 mg | ORAL_TABLET | ORAL | Status: DC | PRN

## 2023-02-07 MED ORDER — SODIUM CHLORIDE 0.9 % IV SOLN: INTRAVENOUS | Status: AC

## 2023-02-07 MED ORDER — POTASSIUM CHLORIDE 20 MEQ PO PACK
40.0000 meq | PACK | Freq: Once | ORAL | Status: AC
  Administered 2023-02-07: 40 meq via ORAL
  Filled 2023-02-07: qty 2

## 2023-02-07 MED ORDER — ACETAMINOPHEN 650 MG RE SUPP: 650.0000 mg | Freq: Four times a day (QID) | RECTAL | Status: DC | PRN

## 2023-02-07 MED ORDER — MELATONIN 3 MG PO TABS: 3.0000 mg | ORAL_TABLET | Freq: Every evening | ORAL | Status: DC | PRN

## 2023-02-07 NOTE — ED Notes (Signed)
Pts girlfriend at bedside.  Stated that the Pt was agitated earlier, but had since calmed down.  CIWA completed.  Ativan administered as the Pt's CIWA met criteria. Blankets and toilet  offered to the Pt.

## 2023-02-07 NOTE — ED Notes (Signed)
ED TO INPATIENT HANDOFF REPORT  ED Nurse Name and Phone #: Dahlia Client [phone]  S Name/Age/Gender Mark Vargas 27 y.o. male Room/Bed: 003C/003C  Code Status   Code Status: Full Code  Home/SNF/Other Rehab Patient oriented to: self, place, time, and situation Is this baseline? Yes   Triage Complete: Triage complete  Chief Complaint Alcohol withdrawal [facility]) [F10.939]  Triage Note Arrives [facility] from [facility] for alcohol detox. Visible tremors in triage.   Usually drinks 15 white claws a day but has had 3.   [facility] administered Librium 50mg  @ 6PM  Valium IM 10mg  @ 6PM, 9PM   Allergies Allergies  Allergen Reactions   Other Hives    Cats    Level of Care/Admitting Diagnosis ED Disposition     ED Disposition  Admit   Condition  --   Comment  Hospital Area: [facility] [[id number]]  Level of Care: Progressive [102]  Admit to Progressive based on following criteria: MULTISYSTEM THREATS such as stable sepsis, metabolic/electrolyte imbalance with or without encephalopathy that is responding to early treatment.  May admit patient to [facility] or [facility] if equivalent level of care is available:: No  Covid Evaluation: Asymptomatic - no recent exposure (last 10 days) testing not required  Diagnosis: Alcohol withdrawal (HCC) [291.81.ICD-9-CM]  Admitting Physician: Angie Fava [[id number]]  Attending Physician: Angie Fava [[id number]]  Certification:: I certify this patient will need inpatient services for at least 2 midnights  Expected Medical Readiness: 02/09/2023          B Medical/Surgery History Past Medical History:  Diagnosis Date   Alcohol abuse    Past Surgical History:  Procedure Laterality Date   DENTAL SURGERY     TONSILLECTOMY       A IV Location/Drains/Wounds Patient Lines/Drains/Airways Status     Active Line/Drains/Airways     Name Placement date Placement time Site Days   Peripheral IV 02/07/23 20  G Left;Posterior Hand 02/07/23  0002  Hand  less than 1            Intake/Output Last 24 hours No intake or output data in the 24 hours ending 02/07/23 2105  Labs/Imaging Results for orders placed or performed during the hospital encounter of 02/06/23 (from the past 48 hour(s))  Comprehensive metabolic panel     Status: Abnormal   Collection Time: 02/07/23 12:05 AM  Result Value Ref Range   Sodium 136 135 - 145 mmol/L   Potassium 3.4 (L) 3.5 - 5.1 mmol/L   Chloride 96 (L) 98 - 111 mmol/L   CO2 21 (L) 22 - 32 mmol/L   Glucose, Bld 103 (H) 70 - 99 mg/dL    Comment: Glucose reference range applies only to samples taken after fasting for at least 8 hours.   BUN <5 (L) 6 - 20 mg/dL    Comment: REPEATED TO VERIFY   Creatinine, Ser 0.63 0.61 - 1.24 mg/dL   Calcium 9.9 8.9 - 13.2 mg/dL   Total Protein 8.2 (H) 6.5 - 8.1 g/dL   Albumin 4.3 3.5 - 5.0 g/dL   AST 440 (H) 15 - 41 U/L   ALT 331 (H) 0 - 44 U/L   Alkaline Phosphatase 108 38 - 126 U/L   Total Bilirubin 1.8 (H) <1.2 mg/dL   GFR, Estimated >10 >27 mL/min    Comment: (NOTE) Calculated using the CKD-EPI Creatinine Equation (2021)    Anion gap 19 (H) 5 - 15    Comment: Performed at [facility], [address] [place], [place] [address]  Ethanol     Status: Abnormal   Collection Time: 02/07/23 12:05 AM  Result Value Ref Range   Alcohol, Ethyl (B) 40 (H) <10 mg/dL    Comment: (NOTE) Lowest detectable limit for serum alcohol is 10 mg/dL.  For medical purposes only. Performed at [facility] Lab, [address], [place], [place] [address]   CBC with Diff     Status: Abnormal   Collection Time: 02/07/23 12:05 AM  Result Value Ref Range   WBC 4.5 4.0 - 10.5 K/uL   RBC 4.77 4.22 - 5.81 MIL/uL   Hemoglobin 15.0 13.0 - 17.0 g/dL   HCT 40.1 02.7 - 25.3 %   MCV 90.1 80.0 - 100.0 fL   MCH 31.4 26.0 - 34.0 pg   MCHC 34.9 30.0 - 36.0 g/dL   RDW 66.4 40.3 - 47.4 %   Platelets 70 (L) 150 - 400 K/uL    Comment:  Immature Platelet Fraction may be clinically indicated, consider ordering this additional test [id number] REPEATED TO VERIFY PLATELET COUNT CONFIRMED BY SMEAR    nRBC 0.0 0.0 - 0.2 %   Neutrophils Relative % 71 %   Neutro Abs 3.2 1.7 - 7.7 K/uL   Lymphocytes Relative 11 %   Lymphs Abs 0.5 (L) 0.7 - 4.0 K/uL   Monocytes Relative 15 %   Monocytes Absolute 0.7 0.1 - 1.0 K/uL   Eosinophils Relative 2 %   Eosinophils Absolute 0.1 0.0 - 0.5 K/uL   Basophils Relative 1 %   Basophils Absolute 0.1 0.0 - 0.1 K/uL   Immature Granulocytes 0 %   Abs Immature Granulocytes 0.01 0.00 - 0.07 K/uL    Comment: Performed at [facility], [address] [place], [place] [address]  CBC with Differential/Platelet     Status: Abnormal   Collection Time: 02/07/23  8:51 AM  Result Value Ref Range   WBC 4.8 4.0 - 10.5 K/uL   RBC 4.56 4.22 - 5.81 MIL/uL   Hemoglobin 14.3 13.0 - 17.0 g/dL   HCT 29.5 18.8 - 41.6 %   MCV 92.1 80.0 - 100.0 fL   MCH 31.4 26.0 - 34.0 pg   MCHC 34.0 30.0 - 36.0 g/dL   RDW 60.6 30.1 - 60.1 %   Platelets 67 (L) 150 - 400 K/uL    Comment: Immature Platelet Fraction may be clinically indicated, consider ordering this additional test [id number] REPEATED TO VERIFY    nRBC 0.0 0.0 - 0.2 %   Neutrophils Relative % 71 %   Neutro Abs 3.4 1.7 - 7.7 K/uL   Lymphocytes Relative 11 %   Lymphs Abs 0.5 (L) 0.7 - 4.0 K/uL   Monocytes Relative 14 %   Monocytes Absolute 0.7 0.1 - 1.0 K/uL   Eosinophils Relative 3 %   Eosinophils Absolute 0.1 0.0 - 0.5 K/uL   Basophils Relative 1 %   Basophils Absolute 0.1 0.0 - 0.1 K/uL   Immature Granulocytes 0 %   Abs Immature Granulocytes 0.02 0.00 - 0.07 K/uL    Comment: Performed at [facility], [address], [place], [place] [address]  Comprehensive metabolic panel     Status: Abnormal   Collection Time: 02/07/23  8:51 AM  Result Value Ref Range   Sodium 136 135 - 145 mmol/L   Potassium 3.5 3.5 - 5.1 mmol/L   Chloride 101 98 -  111 mmol/L   CO2 25 22 -  32 mmol/L   Glucose, Bld 116 (H) 70 - 99 mg/dL    Comment: Glucose reference range applies only to samples taken after fasting for at least 8 hours.   BUN 5 (L) 6 - 20 mg/dL   Creatinine, Ser 4.13 0.61 - 1.24 mg/dL   Calcium 9.8 8.9 - 24.4 mg/dL   Total Protein 7.8 6.5 - 8.1 g/dL   Albumin 4.0 3.5 - 5.0 g/dL   AST 010 (H) 15 - 41 U/L   ALT 298 (H) 0 - 44 U/L   Alkaline Phosphatase 94 38 - 126 U/L   Total Bilirubin 2.1 (H) <1.2 mg/dL   GFR, Estimated >27 >25 mL/min    Comment: (NOTE) Calculated using the CKD-EPI Creatinine Equation (2021)    Anion gap 10 5 - 15    Comment: Performed at [facility], [address] [place], [place] [address]  Magnesium     Status: None   Collection Time: 02/07/23  8:51 AM  Result Value Ref Range   Magnesium 1.7 1.7 - 2.4 mg/dL    Comment: Performed at [facility], [address], [place], [place] [address]  Phosphorus     Status: None   Collection Time: 02/07/23  8:51 AM  Result Value Ref Range   Phosphorus 3.3 2.5 - 4.6 mg/dL    Comment: Performed at [facility], [address] [place], [place] [address]  Protime-INR     Status: None   Collection Time: 02/07/23  8:51 AM  Result Value Ref Range   Prothrombin Time 14.0 11.4 - 15.2 seconds   INR 1.1 0.8 - 1.2    Comment: (NOTE) INR goal varies based on device and disease states. Performed at [facility], [address] [place], [place] [address]   CK     Status: None   Collection Time: 02/07/23  8:51 AM  Result Value Ref Range   Total CK 190 49 - 397 U/L    Comment: Performed at [facility] Lab, [address] [place], [place] [address]  Bilirubin, direct     Status: Abnormal   Collection Time: 02/07/23  8:51 AM  Result Value Ref Range   Bilirubin, Direct 0.8 (H) 0.0 - 0.2 mg/dL    Comment: Performed at [facility], [address] [place], [place] [address]  Lipase, blood     Status: Abnormal   Collection Time: 02/07/23   8:51 AM  Result Value Ref Range   Lipase 162 (H) 11 - 51 U/L    Comment: Performed at [facility], [address] [place], [place] [address]  APTT     Status: None   Collection Time: 02/07/23  8:51 AM  Result Value Ref Range   aPTT 28 24 - 36 seconds    Comment: Performed at [facility], [address] [place], [place] [address]   No results found.  Pending Labs Unresulted Labs (From admission, onward)     Start     Ordered   02/08/23 0500  Comprehensive metabolic panel  Tomorrow morning,   R        02/07/23 0754   02/08/23 0500  CBC  Tomorrow morning,   R        02/07/23 1207   02/06/23 2334  Urine rapid drug screen (hosp performed)  Once,   STAT        02/06/23 2333  Vitals/Pain Today's Vitals   02/07/23 1800 02/07/23 1939 02/07/23 2000 02/07/23 2001  BP: (!) 103/55 117/73 (!) 140/89   Pulse: 98 (!) 101 100   Resp: 20  (!) 25   Temp:    98.4 F (36.9 C)  TempSrc:    Oral  SpO2: 96%  95%   Weight:      Height:      PainSc:        Isolation Precautions No active isolations  Medications Medications  thiamine (VITAMIN B1) tablet 100 mg (100 mg Oral Given 02/07/23 0212)    Or  thiamine (VITAMIN B1) injection 100 mg ( Intravenous See Alternative 02/07/23 0212)  melatonin tablet 3 mg (has no administration in time range)  ondansetron (ZOFRAN) injection 4 mg (has no administration in time range)  acetaminophen (TYLENOL) tablet 650 mg (has no administration in time range)    Or  acetaminophen (TYLENOL) suppository 650 mg (has no administration in time range)  folic acid (FOLVITE) tablet 1 mg (1 mg Oral Not Given 02/07/23 1049)  multivitamin with minerals tablet 1 tablet (1 tablet Oral Given 02/07/23 1257)  0.9 %  sodium chloride infusion ( Intravenous New Bag/Given 02/07/23 0240)  LORazepam (ATIVAN) tablet 1-4 mg ( Oral See Alternative 02/07/23 1953)    Or  LORazepam (ATIVAN) injection 1-4 mg (2 mg Intravenous Given 02/07/23 1953)  potassium  chloride (KLOR-CON) packet 40 mEq (40 mEq Oral Given 02/07/23 0447)    Mobility Walks with assistance     Focused Assessments     R Recommendations: See Admitting Provider Note  Report given to:   Additional Notes:

## 2023-02-07 NOTE — Plan of Care (Signed)

## 2023-02-07 NOTE — ED Notes (Signed)
CIWA now 18. DO notified of pt change.

## 2023-02-07 NOTE — Progress Notes (Signed)
Brief same day note:  Patient is a 27 year old male with history of chronic alcohol abuse who presented to the emergency department for alcohol detoxification.  He consumes 15 white claws a day.  He wants to stop alcohol and was requesting for assistance and presented himself to [facility] but he was noticed to have alcohol withdrawal with tremulousness, tachycardia, anxiety and was sent to the emergency department.  On presentation, he was in sinus tachycardia,appeared in withdrawl.  Lab work showed potassium 3.4, elevated liver enzymes, alcohol level 40.  Patient was admitted for the management of alcohol withdrawal.  Patient seen and examined at bedside today.  Hemodynamically stable.  Comfortable.  Lying on bed.  Appears little anxious, has some tremors of the hands.  Denies any chest pain, shortness of breath, abdominal pain.  Alert and oriented.  Assessment and plan:  Acute alcohol withdrawal: Continue CIWA protocol.  Continue thiamine and folic acid.  Continue gentle IV fluids, multivitamins.  Patient is interested in outpatient alcohol rehabitation after discharge.  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

## 2023-02-07 NOTE — H&P (Signed)
History and Physical      Titus Drone QMV:[id number] DOB: 1995-12-12 DOA: 02/06/2023; DOS: 02/07/2023  PCP: Cheral Bay, MD  Patient coming from: home   I have personally briefly reviewed patient's old medical records in [facility]  Chief Complaint: Alcohol detoxification  HPI: Mark Vargas is a 27 y.o. male with medical history significant for chronic alcohol abuse, who is admitted to [facility] on 02/06/2023 with acute alcohol withdrawal after presenting from home to [facility] ED requesting assistance with alcohol detoxification.  The patient reports that he typically consumes 15 white claw alcoholic seltzers per day, and has been consuming alcohol at this volume and frequency for several months.  However, he now wishes to stop consuming alcohol, and requests assistance with this process.  To this end, he had presented to [facility] on the morning of 02/06/2023, requesting assistance with alcohol detoxification.  However, at that time, staff at [facility] noted the patient to be tremulous, with tachycardia and exhibiting evidence of anxiety, prompting them to recommend that the patient to present instead to [facility] emergency department for further evaluation management of this.  He conveys that most recent alcohol consumed was 2-3 white claws on the morning of 02/06/2023.  Otherwise, denies use of recreational drugs.  Not complaining of any abdominal discomfort or any dysuria.  Denies any associated subjective fever, chills, rigors, or generalized myalgias.  No history of alcohol withdrawal seizures.  Denies visual or auditory hallucinations.  Per chart review, most recent previous liver enzymes were checked on 06/05/2022, and notable for the following at that time: Alkaline phosphatase 102, total bilirubin 0.6, AST 161, ALT 252.    ED Course:  Vital signs in the ED were notable for the following: Afebrile; heart rates in the 1 teens to 120s; systolic blood  pressures in the 140s; respiratory rate 19-24, oxygen saturation 95 to 97% on room air.  Labs were notable for the following: CMP notable for the following: Sodium 136, potassium 3.4, bicarbonate 21, anion gap 19, creatinine 0.63, glucose 103, alkaline phosphatase 108, total bilirubin 1.8, AST 524, ALT 331.  Serum ethanol level 40.  CBC notable for white cell count 4500, hemoglobin 15 with MCV of 90, platelet count 70 compared to most recent prior platelet count of 252 in March 2024.  Per my interpretation, EKG in ED demonstrated the following: Sinus rhythm with heart rate 98, normal intervals and no evidence of T wave or ST changes, including no evidence of ST elevation.  Imaging in the ED, per corresponding formal radiology read, was notable for the following: No additional imaging performed in the ED today.  While in the ED, the following were administered: Ativan 2 mg IV x 1 dose.  Subsequently, the patient was admitted for further evaluation management of acute alcohol withdrawal, with presenting labs notable for anion gap metabolic acidosis, transaminitis, hypokalemia, and thrombocytopenia.     Review of Systems: As per HPI otherwise 10 point review of systems negative.   Past Medical History:  Diagnosis Date   Alcohol abuse     Past Surgical History:  Procedure Laterality Date   DENTAL SURGERY     TONSILLECTOMY      Social History:  reports that he has never smoked. He has never used smokeless tobacco. He reports current alcohol use. He reports that he does not use drugs.   No Known Allergies  Family History  Problem Relation Age of Onset   Healthy Mother    Healthy  Father      Prior to Admission medications   Medication Sig Start Date End Date Taking? Authorizing Provider  ondansetron (ZOFRAN-ODT) 4 MG disintegrating tablet Take 1 tablet (4 mg total) by mouth every 8 (eight) hours as needed for nausea or vomiting. 06/05/22   Gustavus Bryant, FNP  azelastine (ASTELIN)  0.1 % nasal spray Place 2 sprays into both nostrils 2 (two) times daily. 11/17/19 12/31/19  Belinda Fisher, PA-C     Objective    Physical Exam: Vitals:   02/06/23 2308 02/06/23 2316 02/06/23 2345 02/07/23 0045  BP:   (!) 145/98 (!) 140/95  Pulse:   (!) 111 (!) 122  Resp:    (!) 24  Temp:      TempSrc:      SpO2: 96%   97%  Weight:  (!) 145.2 kg    Height:  6\' 2"  (1.88 m)      General: appears to be stated age; alert, oriented Skin: warm, dry, no rash Head:  AT/[place] Mouth:  Oral mucosa membranes appear dry, normal dentition Neck: supple; trachea midline Heart: Tachycardic, but regular; did not appreciate any M/R/G Lungs: CTAB, did not appreciate any wheezes, rales, or rhonchi Abdomen: + BS; soft, ND, NT Vascular: 2+ pedal pulses b/l; 2+ radial pulses b/l Extremities: no peripheral edema, no muscle wasting Neuro: strength and sensation intact in upper and lower extremities b/l; mildly tremulous     Labs on Admission: I have personally reviewed following labs and imaging studies  CBC: Recent Labs  Lab 02/07/23 0005  WBC 4.5  NEUTROABS 3.2  HGB 15.0  HCT 43.0  MCV 90.1  PLT 70*   Basic Metabolic Panel: Recent Labs  Lab 02/07/23 0005  NA 136  K 3.4*  CL 96*  CO2 21*  GLUCOSE 103*  BUN <5*  CREATININE 0.63  CALCIUM 9.9   GFR: Estimated Creatinine Clearance: 212.6 mL/min (by C-G formula based on SCr of 0.63 mg/dL). Liver Function Tests: Recent Labs  Lab 02/07/23 0005  AST 524*  ALT 331*  ALKPHOS 108  BILITOT 1.8*  PROT 8.2*  ALBUMIN 4.3   No results for input(s): "LIPASE", "AMYLASE" in the last 168 hours. No results for input(s): "AMMONIA" in the last 168 hours. Coagulation Profile: No results for input(s): "INR", "PROTIME" in the last 168 hours. Cardiac Enzymes: No results for input(s): "CKTOTAL", "CKMB", "CKMBINDEX", "TROPONINI" in the last 168 hours. BNP (last 3 results) No results for input(s): "PROBNP" in the last 8760 hours. HbA1C: No results  for input(s): "HGBA1C" in the last 72 hours. CBG: No results for input(s): "GLUCAP" in the last 168 hours. Lipid Profile: No results for input(s): "CHOL", "HDL", "LDLCALC", "TRIG", "CHOLHDL", "LDLDIRECT" in the last 72 hours. Thyroid Function Tests: No results for input(s): "TSH", "T4TOTAL", "FREET4", "T3FREE", "THYROIDAB" in the last 72 hours. Anemia Panel: No results for input(s): "VITAMINB12", "FOLATE", "FERRITIN", "TIBC", "IRON", "RETICCTPCT" in the last 72 hours. Urine analysis: No results found for: "COLORURINE", "APPEARANCEUR", "LABSPEC", "PHURINE", "GLUCOSEU", "HGBUR", "BILIRUBINUR", "KETONESUR", "PROTEINUR", "UROBILINOGEN", "NITRITE", "LEUKOCYTESUR"  Radiological Exams on Admission: No results found.    Assessment/Plan   Principal Problem:   Alcohol withdrawal (HCC) Active Problems:   High anion gap metabolic acidosis   Transaminitis   Hypokalemia   Thrombocytopenia (HCC)   Chronic alcohol abuse     #) Acute alcohol withdrawal: In the context of a history of chronic abuse, noting daily consumption of 15 white claw hard seltzers per day, presentation appears associated with early evidence  of alcohol withdrawal, including the presence of mild tachycardia, tremors involving the bilateral lower extremities, anxiety, all in the setting of the patient reporting her desire to completely quit alcohol consumption, will noting most recent alcohol consumed on the morning of 02/06/2023, which appears consistent with presenting serum ethanol level of 40 this evening.  No known history of alcohol withdrawal seizures or DT's.  No evidence of hallucinations at this time.    Plan: Symptoms-based CIWA protocol with prn Ativan ordered. counseled the patient on the importance of reduction in alcohol consumption. Consult to transition of care team placed. Close monitoring of ensuing BP and HR via routine VS. Seizure precautions. Telemetry. Add-on serum Mg level. Check serum phosphorus level.  Repeat CMP in the morning. Check INR. daily oral thiamine, folic acid, multivitamin supplementation, with first dose of thiamine to be administered now.  Check lipase.  As needed Zofran.  Normal saline at 100 cc/h x 10 hours.                  #) Anion gap metabolic acidosis: As evidenced by today's CMP result.  Suspect that this is most likely representative of alcoholic lactic acidosis.  Renal function appears at baseline.  No clinical evidence of underlying infectious process, and SIRS criteria are not met for sepsis.  Given concomitant elevation of liver enzymes, will also check INR to evaluate synthetic hepatic function as relative liver disease may also be contributing to his anion gap metabolic acidosis.  No evidence to suggest seizures at this time.  Plan: Normal saline, as above.  Check INR.  Add on CPK level.  CMP, CBC in the morning.                 #) Transaminitis: In the setting of history of chronic transaminitis as a consequence of his chronic alcohol abuse, today's liver enzymes are found to be slightly higher, as further quantified above.  In the absence of any elevation in alkaline phosphatase and with only minimal elevation of total bilirubin, presentation does not appear to be consistent with a cholestatic process, and is also felt to be less likely representative of acute alcoholic hepatitis, particular noting the absence of any associated abdominal discomfort.  Will continue to closely monitor the trend in his liver enzymes as well as pursue INR/PTT, and assess for ensuing development of abdominal discomfort as a means of further evaluation of ensuing development of evidence of acute alcoholic hepatitis.  At this time, as acute alcoholic otitis appears less likely, we will refrain from initiation of associated steroids for now.  Plan: Repeat CMP in the morning.  Check INR, PTT, CPK level, direct bilirubin level.  Check lipase.  Counseled patient on  importance of reduction in alcohol consumption, as above.  Repeat CBC in the morning.                 #) Hypokalemia: presenting potassium level noted to be 3.4, likely has a contribution from his relative hypo-nutritional status as result of his chronic alcohol abuse.   Plan: monitor on tele. KCl 40 meq p.o. x 1 dose now. Add-on serum mag level. CMP, mag level in the AM.                   #) Thrombocytopenia: Presenting platelet count of 70 compared to most recent prior value of 252 in March 2024.  Differential includes underlying liver disease as a consequence of the patient's chronic alcohol abuse, versus developing acute alcoholic  hepatitis.  No evidence of underlying infectious process, and the possibility of DIC appears less likely.  Will further assess with checking of PTT and INR.  No evidence to suggest acute blood loss at this time and he is not on any blood thinners as an outpatient, including no antiplatelet medications.  Plan: Repeat CBC in the morning.  Check PTT, INR, as above.  Refraining from pharmacologic DVT prophylaxis.  SCDs.  CMP in the morning.      DVT prophylaxis: SCD's   Code Status: Full code Family Communication: none Disposition Plan: Per Rounding Team Consults called: none;  Admission status: Inpatient     I SPENT GREATER THAN 75  MINUTES IN CLINICAL CARE TIME/MEDICAL DECISION-MAKING IN COMPLETING THIS ADMISSION.      Chaney Born Langford Carias DO [facility]  From 7PM - 7AM   02/07/2023, 1:46 AM

## 2023-02-07 NOTE — ED Provider Notes (Signed)
[facility]EMERGENCY DEPT [facility] Emergency Department Provider Note MRN:  [id number]  Arrival date & time: 02/07/23     Chief Complaint   Alcohol Intoxication   History of Present Illness   Mark Vargas is a 27 y.o. year-old male presents to the ED with chief complaint of alcohol withdrawal.  Patient coming in from [facility] for detox.  Last drink was this morning (3 white claws), normally drinks 15.  States that he feels agitated, tremulous, and nauseous.  He denies any hallucinations.  Denies hx of alcohol withdrawal seizures..  History provided by patient.   Review of Systems  Pertinent positive and negative review of systems noted in HPI.    Physical Exam   Vitals:   02/07/23 0435 02/07/23 0541  BP: (!) 146/95   Pulse: (!) 110   Resp:    Temp:  98.8 F (37.1 C)  SpO2:      CONSTITUTIONAL:  anxious-appearing, NAD NEURO:  Alert and oriented x 3, CN 3-12 grossly intact, tremulous EYES:  eyes equal and reactive ENT/NECK:  Supple, no stridor  CARDIO:  tachycardic, regular rhythm, appears well-perfused  PULM:  No respiratory distress, CTAB GI/GU:  non-distended,  MSK/SPINE:  No gross deformities, no edema, moves all extremities  SKIN:  no rash, atraumatic   *Additional and/or pertinent findings included in MDM below  Diagnostic and Interventional Summary    EKG Interpretation Date/Time:  Tuesday February 06 2023 23:39:55 EST Ventricular Rate:  98 PR Interval:  162 QRS Duration:  84 QT Interval:  328 QTC Calculation: 419 R Axis:   62  Text Interpretation: Sinus rhythm Confirmed by Tilden Fossa [phone]) on 02/07/2023 1:09:41 AM       Labs Reviewed  COMPREHENSIVE METABOLIC PANEL - Abnormal; Notable for the following components:      Result Value   Potassium 3.4 (*)    Chloride 96 (*)    CO2 21 (*)    Glucose, Bld 103 (*)    BUN <5 (*)    Total Protein 8.2 (*)    AST 524 (*)    ALT 331 (*)    Total Bilirubin 1.8 (*)    Anion gap 19 (*)     All other components within normal limits  ETHANOL - Abnormal; Notable for the following components:   Alcohol, Ethyl (B) 40 (*)    All other components within normal limits  CBC WITH DIFFERENTIAL/PLATELET - Abnormal; Notable for the following components:   Platelets 70 (*)    Lymphs Abs 0.5 (*)    All other components within normal limits  RAPID URINE DRUG SCREEN, HOSP PERFORMED  CBC WITH DIFFERENTIAL/PLATELET  COMPREHENSIVE METABOLIC PANEL  MAGNESIUM  PHOSPHORUS  PROTIME-INR  CK  BILIRUBIN, DIRECT  LIPASE, BLOOD  APTT    No orders to display    Medications  thiamine (VITAMIN B1) tablet 100 mg (100 mg Oral Given 02/07/23 0212)    Or  thiamine (VITAMIN B1) injection 100 mg ( Intravenous See Alternative 02/07/23 0212)  melatonin tablet 3 mg (has no administration in time range)  ondansetron (ZOFRAN) injection 4 mg (has no administration in time range)  acetaminophen (TYLENOL) tablet 650 mg (has no administration in time range)    Or  acetaminophen (TYLENOL) suppository 650 mg (has no administration in time range)  folic acid (FOLVITE) tablet 1 mg (has no administration in time range)  multivitamin with minerals tablet 1 tablet (has no administration in time range)  0.9 %  sodium chloride infusion ( Intravenous  New Bag/Given 02/07/23 0240)  LORazepam (ATIVAN) tablet 1-4 mg ( Oral See Alternative 02/07/23 0330)    Or  LORazepam (ATIVAN) injection 1-4 mg (3 mg Intravenous Given 02/07/23 0330)  LORazepam (ATIVAN) 2 MG/ML injection (has no administration in time range)  potassium chloride (KLOR-CON) packet 40 mEq (40 mEq Oral Given 02/07/23 0447)     Procedures  /  Critical Care .Critical Care  Performed by: Roxy Horseman, PA-C Authorized by: Roxy Horseman, PA-C   Critical care provider statement:    Critical care time (minutes):  34   Critical care was time spent personally by me on the following activities:  Development of treatment plan with patient or surrogate,  discussions with consultants, evaluation of patient's response to treatment, examination of patient, ordering and review of laboratory studies, ordering and review of radiographic studies, ordering and performing treatments and interventions, pulse oximetry, re-evaluation of patient's condition and review of old charts   ED Course and Medical Decision Making  I have reviewed the triage vital signs, the nursing notes, and pertinent available records from the EMR.  Social Determinants Affecting Complexity of Care: Patient has no clinically significant social determinants affecting this chief complaint..   ED Course: Clinical Course as of 02/07/23 0548  Wed Feb 07, 2023  0004 I calculate CIWA at 50 on my exam.  CIWA protocol ordered.  Will continue to monitor.  May need admission for acute withdrawal. [RB]  0548 Comprehensive metabolic panel(!) Elevated anion gap of 19, consider AKA. [RB]    Clinical Course User Index [RB] Roxy Horseman, PA-C    Medical Decision Making Amount and/or Complexity of Data Reviewed Labs: ordered.  Risk OTC drugs. Prescription drug management. Decision regarding hospitalization.         Consultants: I consulted with Hospitalist, Dr. Arlean Hopping, who is appreciated for admitting.   Treatment and Plan: Patient's exam and diagnostic results are concerning for acute alcohol withdrawal.  Feel that patient will need admission to the hospital for further treatment and evaluation.    Final Clinical Impressions(s) / ED Diagnoses     ICD-10-CM   1. Alcohol withdrawal syndrome without complication (HCC)  F10.930     2. Alcoholic ketoacidosis  E87.29       ED Discharge Orders     None         Discharge Instructions Discussed with and Provided to Patient:   Discharge Instructions   None      Roxy Horseman, PA-C 02/07/23 0549    Tilden Fossa, MD 02/07/23 [phone]

## 2023-02-07 NOTE — ED Notes (Signed)
PT placed on O2 via [place] at 2 lpm due to SpO2 dipping into low 90s upper 80s while PT is resting.

## 2023-02-08 DIAGNOSIS — F10939 Alcohol use, unspecified with withdrawal, unspecified: Secondary | ICD-10-CM | POA: Diagnosis not present

## 2023-02-08 LAB — COMPREHENSIVE METABOLIC PANEL
ALT: 263 U/L — ABNORMAL HIGH (ref 0–44)
AST: 329 U/L — ABNORMAL HIGH (ref 15–41)
Albumin: 3.8 g/dL (ref 3.5–5.0)
Alkaline Phosphatase: 95 U/L (ref 38–126)
Anion gap: 10 (ref 5–15)
BUN: <5 mg/dL — ABNORMAL LOW (ref 6–20)
CO2: 22 mmol/L (ref 22–32)
Calcium: 9.9 mg/dL (ref 8.9–10.3)
Chloride: 102 mmol/L (ref 98–111)
Creatinine, Ser: 0.71 mg/dL (ref 0.61–1.24)
GFR, Estimated: >60 mL/min (ref >60)
Glucose, Bld: 98 mg/dL (ref 70–99)
Potassium: 3.5 mmol/L (ref 3.5–5.1)
Sodium: 134 mmol/L — ABNORMAL LOW (ref 135–145)
Total Bilirubin: 2.3 mg/dL — ABNORMAL HIGH (ref <1.2)
Total Protein: 7.5 g/dL (ref 6.5–8.1)

## 2023-02-08 LAB — CBC
HCT: 42.6 % (ref 39.0–52.0)
Hemoglobin: 14.4 g/dL (ref 13.0–17.0)
MCH: 30.8 pg (ref 26.0–34.0)
MCHC: 33.8 g/dL (ref 30.0–36.0)
MCV: 91 fL (ref 80.0–100.0)
Platelets: 72 10*3/uL — ABNORMAL LOW (ref 150–400)
RBC: 4.68 MIL/uL (ref 4.22–5.81)
RDW: 12.3 % (ref 11.5–15.5)
WBC: 5.1 10*3/uL (ref 4.0–10.5)
nRBC: 0 % (ref 0.0–0.2)

## 2023-02-08 LAB — MAGNESIUM: Magnesium: 1.7 mg/dL (ref 1.7–2.4)

## 2023-02-08 MED ORDER — LACTATED RINGERS IV BOLUS
1000.0000 mL | Freq: Once | INTRAVENOUS | Status: AC
  Administered 2023-02-08: 1000 mL via INTRAVENOUS

## 2023-02-08 MED ORDER — LORAZEPAM 1 MG PO TABS
2.0000 mg | ORAL_TABLET | ORAL | Status: DC
  Administered 2023-02-08 – 2023-02-10 (×12): 2 mg via ORAL
  Filled 2023-02-08 (×12): qty 2

## 2023-02-08 MED ORDER — PHENOBARBITAL SODIUM 130 MG/ML IJ SOLN
130.0000 mg | Freq: Two times a day (BID) | INTRAMUSCULAR | Status: AC
  Administered 2023-02-08 – 2023-02-09 (×2): 130 mg via INTRAVENOUS
  Filled 2023-02-08 (×3): qty 1

## 2023-02-08 MED ORDER — CLONIDINE HCL 0.1 MG PO TABS
0.1000 mg | ORAL_TABLET | Freq: Three times a day (TID) | ORAL | Status: DC
  Administered 2023-02-08 – 2023-02-12 (×12): 0.1 mg via ORAL
  Filled 2023-02-08 (×12): qty 1

## 2023-02-08 MED ORDER — LORAZEPAM 2 MG/ML IJ SOLN
1.0000 mg | INTRAMUSCULAR | Status: AC | PRN
  Administered 2023-02-08: 2 mg via INTRAVENOUS
  Filled 2023-02-08 (×2): qty 1

## 2023-02-08 MED ORDER — LORAZEPAM 1 MG PO TABS
1.0000 mg | ORAL_TABLET | ORAL | Status: AC | PRN
  Administered 2023-02-09: 1 mg via ORAL
  Filled 2023-02-08: qty 1

## 2023-02-08 NOTE — Progress Notes (Signed)
Keeps calling nursing station for ativan, although he is scoring a 4 on the CIWA scale.  Said he thought he would get it instead of alcohol and requests if every hour or so.  Is reminded that it is only to be given if needed and he said he needs it because it makes him feel good.

## 2023-02-08 NOTE — Progress Notes (Signed)
HR has been sustained in the 130s-150s.  He is more agitated and trying to get out of the bed unassisted and did yell at and swear at girlfriend.  Angry because he cannot be up ad lib, although he is very unsteady wihen up and more agitated and tremulous.  Shaky and very irritable.  Family at side and asked to help keep him calm and to help eliminate his stress and agitation.  He is now calmer with the ativan dose that he just got.  A while ago, they were asking if there was something else he could have that would calm him down, as the lorazepam seemed to be causing more agitation at that time.  I did reach out to Dr Gerri Lins to report and she added Phenobarb and changed Ativan order and also added a bolus of LR for HR.

## 2023-02-08 NOTE — Assessment & Plan Note (Addendum)
Platelets have improved from 72 to 101 today. Bilirubin is declining along with his liver functions . T bili is now 1.6 down from 2.1.

## 2023-02-08 NOTE — Assessment & Plan Note (Addendum)
Resolved. Monitor and supplement as necessary.

## 2023-02-08 NOTE — Progress Notes (Signed)
Progress Note   Patient: Mark Vargas VQQ:[id number] DOB: [birth year] DOA: 02/06/2023     1 DOS: the patient was seen and examined on 02/08/2023   Brief hospital course: Patient is a 27 year old male with history of chronic alcohol abuse who presented to the emergency department for alcohol detoxification.  He consumes 15 white claws a day.  He wants to stop alcohol and was requesting for assistance and presented himself to [facility] but he was noticed to have alcohol withdrawal with tremulousness, tachycardia, anxiety and was sent to the emergency department.  On presentation, he was in sinus tachycardia,appeared in withdrawl.  Lab work showed potassium 3.4, elevated liver enzymes, alcohol level 40.  Patient was admitted for the management of alcohol withdrawal.   The patient's last drink was 02/06/2023 at 11;00 am. He has been admitted to a telemetry bed with Ativan by CIWA protocol. On the am of 02/08/2023 the patient stated that he was feeling better, although he continued to be quite tremulous. By the afternoon the patient's heart rate was sustaining in the 140-150 range. He was given a 1l bolus of IV NS and started on phenobarbital. He was also started on clonidine. It seems that the patient's symptoms are increasing as time from his last drink is growing.   I will add scheduled ativan to his prn CIWA doses.  Assessment and Plan: Alcohol withdrawal (HCC) The patient's last drink was 02/06/2023 at 11;00 am. He has been admitted to a telemetry bed with Ativan by CIWA protocol. On the am of 02/08/2023 the patient stated that he was feeling better, although he continued to be quite tremulous. By the afternoon the patient's heart rate was sustaining in the 140-150 range. He was given a 1l bolus of IV NS and started on phenobarbital. He was also started on clonidine. It seems that the patient's symptoms are increasing as time from his last drink is growing.   I will add scheduled ativan to his  prn CIWA doses.  Chronic alcohol abuse Patient wishes to seek help with alcohol rehab upon completion of his withdrawal.  He is receiving thiamine and folate.   High anion gap metabolic acidosis Alcoholic metabolic acidosis.  Hypokalemia Monitor and supplement as necessary.  Thrombocytopenia (HCC) Platelets are low, but stable. The patient does have numerous ecchymoses about his body, but they are not severe. His hemoglobin is stable. Will continue to monitor and transfuse for acute bleeding or platelets below 10. He is currently at 58.   Transaminitis Due to alcoholic hepatitis. AST, ALT, are slowly coming down. Continue to monitor.       Subjective: The patient is sitting up in bed with his girlfriend at his side. He states that he feels better than yesterday, but still very shaky.  Physical Exam: Vitals:   02/08/23 1500 02/08/23 1514 02/08/23 1543 02/08/23 1628  BP: 126/78 122/70 (!) 124/98 128/70  Pulse: (!) 128 (!) 104 (!) 142   Resp:  18 18 20   Temp:  98.6 F (37 C) 98.4 F (36.9 C)   TempSrc:   Oral   SpO2:  96% 95%   Weight:      Height:      Exam:  Constitutional:  The patient is awake, alert, and oriented x 3. Mild distress and tremors. ENMT:  Positive tongue fasciculations Respiratory:  No increased work of breathing. No wheezes, rales, or rhonchi No tactile fremitus Cardiovascular:  Regular rate and rhythm No murmurs, ectopy, or gallups. No lateral PMI. No thrills.  Abdomen:  Abdomen is soft, non-tender, non-distended No hernias, masses, or organomegaly Normoactive bowel sounds.  Musculoskeletal:  No cyanosis, clubbing, or edema Skin:  No rashes, lesions, ulcers palpation of skin: no induration or nodules Neurologic:  CN 2-12 intact Sensation all 4 extremities intact Positive tremors and tongue fasciculations. Psychiatric:  Mental status Anxious Orientation to person, place, time  judgment and insight appear intact  Data  Reviewed:  CMP, CBC  Family Communication: Discussed patient with father with patient's consent.  Disposition: Status is: Inpatient Remains inpatient appropriate because: Pt is actively withdrawing.  Planned Discharge Destination: Home    Time spent: 38 minutes  Author: Casmira Cramer, DO 02/08/2023 4:58 PM  For on call review [email or web address].

## 2023-02-08 NOTE — TOC Initial Note (Signed)
Transition of Care [facility]) - Initial/Assessment Note    Patient Details  Name: Mark Vargas MRN: [id number] Date of Birth: [birth year]  Transition of Care [facility]) CM/SW Contact:    Kermit Balo, RN Phone Number: 02/08/2023, 2:13 PM  Clinical Narrative:                  Pt from [facility] for alcohol rehab. Per SO at the bedside the plan is for him to return to [facility] at d/c. Per patient he is not sure he wants to return.  Pt may stay with his father after alcohol rehab. So states they are working this out.  Pt has been non-compliant with his medications at home. Family is able to provide needed transportation. TOC following.  Expected Discharge Plan:  ([facility]) Barriers to Discharge: Continued Medical Work up   Patient Goals and CMS Choice            Expected Discharge Plan and Services   Discharge Planning Services: CM Consult   Living arrangements for the past 2 months: Single Family Home                                      Prior Living Arrangements/Services Living arrangements for the past 2 months: Single Family Home Lives with:: Significant Other Patient language and need for interpreter reviewed:: Yes Do you feel safe going back to the place where you live?: Yes            Criminal Activity/Legal Involvement Pertinent to Current Situation/Hospitalization: No - Comment as needed  Activities of Daily Living   ADL Screening (condition at time of admission) Independently performs ADLs?: Yes (appropriate for developmental age) Is the patient deaf or have difficulty hearing?: No Does the patient have difficulty seeing, even when wearing glasses/contacts?: No Does the patient have difficulty concentrating, remembering, or making decisions?: No  Permission Sought/Granted                  Emotional Assessment Appearance:: Appears stated age Attitude/Demeanor/Rapport: Guarded, Reactive, Suspicious Affect (typically  observed): Defensive, Anxious, Irritable Orientation: : Oriented to Self, Oriented to Place Alcohol / Substance Use: Alcohol Use Psych Involvement: No (comment)  Admission diagnosis:  Alcohol withdrawal (HCC) [F10.939] Alcoholic ketoacidosis [E87.29] Alcohol withdrawal syndrome without complication (HCC) [F10.930] Patient Active Problem List   Diagnosis Date Noted   Alcohol withdrawal (HCC) 02/07/2023   High anion gap metabolic acidosis 02/07/2023   Transaminitis 02/07/2023   Hypokalemia 02/07/2023   Thrombocytopenia (HCC) 02/07/2023   Chronic alcohol abuse 02/07/2023   PCP:  Cheral Bay, MD Pharmacy:   [facility] [phone] [address], [place] - [address] [place] [address] Phone: [phone] Fax: [phone]  [id number] - [place], [place] - [address] [place] [address] Phone: [phone] Fax: [phone]     Social Determinants of Health (SDOH) Social History: SDOH Screenings   Food Insecurity: No Food Insecurity (02/07/2023)  Housing: Low Risk  (02/07/2023)  Transportation Needs: No Transportation Needs (02/07/2023)  Utilities: Not At Risk (02/07/2023)  Tobacco Use: Low Risk  (02/07/2023)   SDOH Interventions:     Readmission Risk Interventions     No data to display

## 2023-02-08 NOTE — Assessment & Plan Note (Signed)
Due to alcoholic hepatitis. AST, ALT, are slowly coming down. Continue to monitor.

## 2023-02-08 NOTE — Assessment & Plan Note (Addendum)
Alcoholic metabolic acidosis. Resolved.

## 2023-02-08 NOTE — Progress Notes (Signed)
Received scheduled Lorazepam 2 mg po for CIWA of 12.

## 2023-02-08 NOTE — TOC CAGE-AID Note (Signed)
Transition of Care [facility]) - CAGE-AID Screening   Patient Details  Name: Kahil Agner MRN: [id number] Date of Birth: 04/03/1996  Transition of Care [facility]) CM/SW Contact:    Kermit Balo, RN Phone Number: 02/08/2023, 2:11 PM   Clinical Narrative: Pt was at [facility] prior to admission and plan is to return there when medically ready.    CAGE-AID Screening:    Have You Ever Felt You Ought to Cut Down on Your Drinking or Drug Use?: Yes Have People Annoyed You By Critizing Your Drinking Or Drug Use?: Yes Have You Felt Bad Or Guilty About Your Drinking Or Drug Use?: Yes Have You Ever Had a Drink or Used Drugs First Thing In The Morning to Steady Your Nerves or to Get Rid of a Hangover?: Yes CAGE-AID Score: 4  Substance Abuse Education Offered: Yes

## 2023-02-08 NOTE — Assessment & Plan Note (Addendum)
The patient's last drink was 02/06/2023 at 11;00 am. He has been admitted to a telemetry bed with Ativan by CIWA protocol. On the am of 02/08/2023 the patient stated that he was feeling better, although he continued to be quite tremulous. By the afternoon the patient's heart rate was sustaining in the 140-150 range. He was given a 1l bolus of IV NS and started on phenobarbital. He was also started on clonidine. It seems that the patient's symptoms are increasing as time from his last drink is growing. Scheduled Ativan was added to his regimen.  He seems to be improving with respect to his tremors and fasciculations, but he seemed just a little confused today. He is however, oriented x 3.

## 2023-02-08 NOTE — Plan of Care (Signed)
  Problem: Activity: Goal: Risk for activity intolerance will decrease Outcome: Progressing   Problem: Nutrition: Goal: Adequate nutrition will be maintained Outcome: Progressing   Problem: Coping: Goal: Level of anxiety will decrease Outcome: Progressing   Problem: Safety: Goal: Ability to remain free from injury will improve Outcome: Progressing   Problem: Skin Integrity: Goal: Risk for impaired skin integrity will decrease Outcome: Progressing   

## 2023-02-08 NOTE — Assessment & Plan Note (Signed)
Patient wishes to seek help with alcohol rehab upon completion of his withdrawal.  He is receiving thiamine and folate.

## 2023-02-09 DIAGNOSIS — F10939 Alcohol use, unspecified with withdrawal, unspecified: Secondary | ICD-10-CM | POA: Diagnosis not present

## 2023-02-09 LAB — CBC WITH DIFFERENTIAL/PLATELET
Abs Immature Granulocytes: 0.02 10*3/uL (ref 0.00–0.07)
Basophils Absolute: 0.1 10*3/uL (ref 0.0–0.1)
Basophils Relative: 1 %
Eosinophils Absolute: 0.3 10*3/uL (ref 0.0–0.5)
Eosinophils Relative: 4 %
HCT: 45.2 % (ref 39.0–52.0)
Hemoglobin: 15.2 g/dL (ref 13.0–17.0)
Immature Granulocytes: 0 %
Lymphocytes Relative: 14 %
Lymphs Abs: 1.2 10*3/uL (ref 0.7–4.0)
MCH: 31.1 pg (ref 26.0–34.0)
MCHC: 33.6 g/dL (ref 30.0–36.0)
MCV: 92.4 fL (ref 80.0–100.0)
Monocytes Absolute: 1.1 10*3/uL — ABNORMAL HIGH (ref 0.1–1.0)
Monocytes Relative: 13 %
Neutro Abs: 5.8 10*3/uL (ref 1.7–7.7)
Neutrophils Relative %: 68 %
Platelets: 101 10*3/uL — ABNORMAL LOW (ref 150–400)
RBC: 4.89 MIL/uL (ref 4.22–5.81)
RDW: 12.1 % (ref 11.5–15.5)
WBC: 8.4 10*3/uL (ref 4.0–10.5)
nRBC: 0 % (ref 0.0–0.2)

## 2023-02-09 LAB — COMPREHENSIVE METABOLIC PANEL
ALT: 223 U/L — ABNORMAL HIGH (ref 0–44)
AST: 198 U/L — ABNORMAL HIGH (ref 15–41)
Albumin: 3.9 g/dL (ref 3.5–5.0)
Alkaline Phosphatase: 101 U/L (ref 38–126)
Anion gap: 14 (ref 5–15)
BUN: 8 mg/dL (ref 6–20)
CO2: 21 mmol/L — ABNORMAL LOW (ref 22–32)
Calcium: 10.2 mg/dL (ref 8.9–10.3)
Chloride: 104 mmol/L (ref 98–111)
Creatinine, Ser: 0.81 mg/dL (ref 0.61–1.24)
GFR, Estimated: >60 mL/min (ref >60)
Glucose, Bld: 94 mg/dL (ref 70–99)
Potassium: 3.9 mmol/L (ref 3.5–5.1)
Sodium: 139 mmol/L (ref 135–145)
Total Bilirubin: 1.6 mg/dL — ABNORMAL HIGH (ref <1.2)
Total Protein: 7.8 g/dL (ref 6.5–8.1)

## 2023-02-09 NOTE — Progress Notes (Signed)
Progress Note   Patient: Mark Vargas ZOX:[id number] DOB: [birth year] DOA: 02/06/2023     2 DOS: the patient was seen and examined on 02/09/2023   Brief hospital course: Patient is a 27 year old male with history of chronic alcohol abuse who presented to the emergency department for alcohol detoxification.  He consumes 15 white claws a day.  He wants to stop alcohol and was requesting for assistance and presented himself to [facility] but he was noticed to have alcohol withdrawal with tremulousness, tachycardia, anxiety and was sent to the emergency department.  On presentation, he was in sinus tachycardia,appeared in withdrawl.  Lab work showed potassium 3.4, elevated liver enzymes, alcohol level 40.  Patient was admitted for the management of alcohol withdrawal.   The patient's last drink was 02/06/2023 at 11;00 am. He has been admitted to a telemetry bed with Ativan by CIWA protocol. On the am of 02/08/2023 the patient stated that he was feeling better, although he continued to be quite tremulous. By the afternoon the patient's heart rate was sustaining in the 140-150 range. He was given a 1l bolus of IV NS and started on phenobarbital. He was also started on clonidine. It seems that the patient's symptoms are increasing as time from his last drink is growing. He was also started on phenobarbital 130 mg bid.   His heart rate has slowed down. His tremors and fasciculations of the tongue have improved on 02/09/2023.   Assessment and Plan: Alcohol withdrawal (HCC) The patient's last drink was 02/06/2023 at 11;00 am. He has been admitted to a telemetry bed with Ativan by CIWA protocol. On the am of 02/08/2023 the patient stated that he was feeling better, although he continued to be quite tremulous. By the afternoon the patient's heart rate was sustaining in the 140-150 range. He was given a 1l bolus of IV NS and started on phenobarbital. He was also started on clonidine. It seems that the patient's  symptoms are increasing as time from his last drink is growing. Scheduled Ativan was added to his regimen.  He seems to be improving with respect to his tremors and fasciculations, but he seemed just a little confused today. He is however, oriented x 3.    Chronic alcohol abuse Patient wishes to seek help with alcohol rehab upon completion of his withdrawal.  He is receiving thiamine and folate.   High anion gap metabolic acidosis Alcoholic metabolic acidosis. Resolved.  Hypokalemia Resolved. Monitor and supplement as necessary.  Thrombocytopenia (HCC) Platelets have improved from 72 to 101 today. Bilirubin is declining along with his liver functions . T bili is now 1.6 down from 2.1.   Transaminitis Due to alcoholic hepatitis. AST, ALT, are slowly coming down. Continue to monitor.       Subjective: The patient is sitting up in bed. He states that he is feeling better. He continues to have tongue fasciculations and tremors of his hands. However, he seems to be mildly confused. He is oriented x 3, but seems to have odd word choices.  Physical Exam: Vitals:   02/08/23 2333 02/09/23 0344 02/09/23 0802 02/09/23 1130  BP: 111/73 128/79 131/82 97/62  Pulse: (!) 101 98 74   Resp: 18 18 16 16   Temp: (!) 97.5 F (36.4 C) 98.2 F (36.8 C) 98.2 F (36.8 C) (!) 97.3 F (36.3 C)  TempSrc: Oral Oral Oral   SpO2: 91% 97% 100% 95%  Weight:      Height:      Exam:  Constitutional:  The patient is awake, alert, and oriented x 3. Mild distress and tremors. ENMT:  Positive tongue fasciculations, improved. Respiratory:  No increased work of breathing. No wheezes, rales, or rhonchi No tactile fremitus Cardiovascular:  Regular rate and rhythm No murmurs, ectopy, or gallups. No lateral PMI. No thrills. Abdomen:  Abdomen is soft, non-tender, non-distended No hernias, masses, or organomegaly Normoactive bowel sounds.  Musculoskeletal:  No cyanosis, clubbing, or edema Skin:  No  rashes, lesions, ulcers palpation of skin: no induration or nodules Neurologic:  CN 2-12 intact Sensation all 4 extremities intact Positive tremors and tongue fasciculations. Improving. Psychiatric:  Mental status Anxious Orientation to person, place, time  judgment and insight appear intact  Data Reviewed:  CMP, CBC  Family Communication: Discussed patient with father with patient's consent.  Disposition: Status is: Inpatient Remains inpatient appropriate because: Pt is actively withdrawing.  Planned Discharge Destination: Home    Time spent: 38 minutes  Author: Chigozie Basaldua, DO 02/09/2023 2:52 PM  For on call review [email or web address].

## 2023-02-09 NOTE — TOC Progression Note (Signed)
Transition of Care [facility]) - Progression Note    Patient Details  Name: Mark Vargas MRN: [id number] Date of Birth: [birth year]  Transition of Care [facility]) CM/SW Contact  Kermit Balo, RN Phone Number: 02/09/2023, 12:45 PM  Clinical Narrative:     Left voicemail for nurse at [facility] but MD says not medically ready yet.  TOC following.   Expected Discharge Plan:  ([facility]) Barriers to Discharge: Continued Medical Work up  Expected Discharge Plan and Services   Discharge Planning Services: CM Consult   Living arrangements for the past 2 months: Single Family Home                                       Social Determinants of Health (SDOH) Interventions SDOH Screenings   Food Insecurity: No Food Insecurity (02/07/2023)  Housing: Low Risk  (02/07/2023)  Transportation Needs: No Transportation Needs (02/07/2023)  Utilities: Not At Risk (02/07/2023)  Tobacco Use: Low Risk  (02/07/2023)    Readmission Risk Interventions     No data to display

## 2023-02-09 NOTE — Plan of Care (Signed)
CIWA every 4 hours. Reinforced safety precautions with bed alarm and fall preventions socks.   Problem: Education: Goal: Knowledge of General Education information will improve Description: Including pain rating scale, medication(s)/side effects and non-pharmacologic comfort measures 02/09/2023 2005 by Kallie Locks, RN Outcome: Progressing 02/09/2023 2004 by Kallie Locks, RN Outcome: Progressing   Problem: Health Behavior/Discharge Planning: Goal: Ability to manage health-related needs will improve 02/09/2023 2005 by Kallie Locks, RN Outcome: Progressing 02/09/2023 2004 by Kallie Locks, RN Outcome: Progressing   Problem: Clinical Measurements: Goal: Ability to maintain clinical measurements within normal limits will improve 02/09/2023 2005 by Kallie Locks, RN Outcome: Progressing 02/09/2023 2004 by Kallie Locks, RN Outcome: Progressing Goal: Diagnostic test results will improve Outcome: Progressing   Problem: Safety: Goal: Ability to remain free from injury will improve Outcome: Progressing

## 2023-02-09 NOTE — Plan of Care (Signed)

## 2023-02-10 DIAGNOSIS — F10939 Alcohol use, unspecified with withdrawal, unspecified: Secondary | ICD-10-CM | POA: Diagnosis not present

## 2023-02-10 LAB — COMPREHENSIVE METABOLIC PANEL
ALT: 158 U/L — ABNORMAL HIGH (ref 0–44)
AST: 103 U/L — ABNORMAL HIGH (ref 15–41)
Albumin: 3.5 g/dL (ref 3.5–5.0)
Alkaline Phosphatase: 86 U/L (ref 38–126)
Anion gap: 9 (ref 5–15)
BUN: 7 mg/dL (ref 6–20)
CO2: 23 mmol/L (ref 22–32)
Calcium: 9.9 mg/dL (ref 8.9–10.3)
Chloride: 102 mmol/L (ref 98–111)
Creatinine, Ser: 0.71 mg/dL (ref 0.61–1.24)
GFR, Estimated: >60 mL/min (ref >60)
Glucose, Bld: 103 mg/dL — ABNORMAL HIGH (ref 70–99)
Potassium: 3.5 mmol/L (ref 3.5–5.1)
Sodium: 134 mmol/L — ABNORMAL LOW (ref 135–145)
Total Bilirubin: 1.4 mg/dL — ABNORMAL HIGH (ref <1.2)
Total Protein: 6.8 g/dL (ref 6.5–8.1)

## 2023-02-10 LAB — CBC WITH DIFFERENTIAL/PLATELET
Abs Immature Granulocytes: 0.02 10*3/uL (ref 0.00–0.07)
Basophils Absolute: 0.1 10*3/uL (ref 0.0–0.1)
Basophils Relative: 1 %
Eosinophils Absolute: 0.3 10*3/uL (ref 0.0–0.5)
Eosinophils Relative: 4 %
HCT: 41.8 % (ref 39.0–52.0)
Hemoglobin: 14.3 g/dL (ref 13.0–17.0)
Immature Granulocytes: 0 %
Lymphocytes Relative: 11 %
Lymphs Abs: 0.8 10*3/uL (ref 0.7–4.0)
MCH: 31.4 pg (ref 26.0–34.0)
MCHC: 34.2 g/dL (ref 30.0–36.0)
MCV: 91.7 fL (ref 80.0–100.0)
Monocytes Absolute: 1.3 10*3/uL — ABNORMAL HIGH (ref 0.1–1.0)
Monocytes Relative: 18 %
Neutro Abs: 4.9 10*3/uL (ref 1.7–7.7)
Neutrophils Relative %: 66 %
Platelets: 110 10*3/uL — ABNORMAL LOW (ref 150–400)
RBC: 4.56 MIL/uL (ref 4.22–5.81)
RDW: 12.3 % (ref 11.5–15.5)
WBC: 7.4 10*3/uL (ref 4.0–10.5)
nRBC: 0 % (ref 0.0–0.2)

## 2023-02-10 MED ORDER — CHLORDIAZEPOXIDE HCL 10 MG PO CAPS
10.0000 mg | ORAL_CAPSULE | Freq: Three times a day (TID) | ORAL | Status: DC
  Filled 2023-02-10: qty 1

## 2023-02-10 MED ORDER — CHLORDIAZEPOXIDE HCL 5 MG PO CAPS
10.0000 mg | ORAL_CAPSULE | Freq: Three times a day (TID) | ORAL | Status: DC
  Administered 2023-02-10 – 2023-02-11 (×4): 10 mg via ORAL
  Filled 2023-02-10 (×4): qty 2

## 2023-02-10 NOTE — Plan of Care (Signed)
Pt alert and oriented x 4. Complains of headache, see MAR. Continent of bowel and bladder. Ambulating in room. Girlfriend and family to bedside this shift. Will continue with current plan of care.   Problem: Education: Goal: Knowledge of General Education information will improve Description: Including pain rating scale, medication(s)/side effects and non-pharmacologic comfort measures Outcome: Progressing   Problem: Health Behavior/Discharge Planning: Goal: Ability to manage health-related needs will improve Outcome: Progressing   Problem: Clinical Measurements: Goal: Ability to maintain clinical measurements within normal limits will improve Outcome: Progressing Goal: Will remain free from infection Outcome: Progressing Goal: Diagnostic test results will improve Outcome: Progressing Goal: Respiratory complications will improve Outcome: Progressing Goal: Cardiovascular complication will be avoided Outcome: Progressing   Problem: Activity: Goal: Risk for activity intolerance will decrease Outcome: Progressing   Problem: Nutrition: Goal: Adequate nutrition will be maintained Outcome: Progressing   Problem: Coping: Goal: Level of anxiety will decrease Outcome: Progressing   Problem: Elimination: Goal: Will not experience complications related to bowel motility Outcome: Progressing Goal: Will not experience complications related to urinary retention Outcome: Progressing   Problem: Pain Management: Goal: General experience of comfort will improve Outcome: Progressing   Problem: Safety: Goal: Ability to remain free from injury will improve Outcome: Progressing   Problem: Skin Integrity: Goal: Risk for impaired skin integrity will decrease Outcome: Progressing

## 2023-02-10 NOTE — Progress Notes (Signed)
Progress Note   Patient: Mark Vargas VWU:[id number] DOB: 1995-05-30 DOA: 02/06/2023     3 DOS: the patient was seen and examined on 02/10/2023   Brief hospital course: Patient is a 27 year old male with history of chronic alcohol abuse who presented to the emergency department for alcohol detoxification.  He consumes 15 white claws a day.  He wants to stop alcohol and was requesting for assistance and presented himself to [facility] but he was noticed to have alcohol withdrawal with tremulousness, tachycardia, anxiety and was sent to the emergency department.  On presentation, he was in sinus tachycardia,appeared in withdrawl.  Lab work showed potassium 3.4, elevated liver enzymes, alcohol level 40.  Patient was admitted for the management of alcohol withdrawal.   The patient's last drink was 02/06/2023 at 11;00 am. He has been admitted to a telemetry bed with Ativan by CIWA protocol. On the am of 02/08/2023 the patient stated that he was feeling better, although he continued to be quite tremulous. By the afternoon the patient's heart rate was sustaining in the 140-150 range. He was given a 1l bolus of IV NS and started on phenobarbital. He was also started on clonidine. It seems that the patient's symptoms are increasing as time from his last drink is growing. He was also started on phenobarbital 130 mg bid.   His heart rate has slowed down. His tremors and fasciculations of the tongue have improved on 02/09/2023.   Assessment and Plan: Alcohol withdrawal (HCC) The patient's last drink was 02/06/2023 at 11;00 am. He has been admitted to a telemetry bed with Ativan by CIWA protocol. On the am of 02/08/2023 the patient stated that he was feeling better, although he continued to be quite tremulous. By the afternoon the patient's heart rate was sustaining in the 140-150 range. He was given a 1l bolus of IV NS and started on phenobarbital. He was also started on clonidine. It seems that the patient's  symptoms are increasing as time from his last drink is growing. Scheduled Ativan has been replaced with scheduled librium. Will continue to wean.  Tremors and fasciculations are continuing to decrease.    Chronic alcohol abuse Patient wishes to seek help with alcohol rehab upon completion of his withdrawal.  He is receiving thiamine and folate.   High anion gap metabolic acidosis Alcoholic metabolic acidosis. Resolved.  Hypokalemia Resolved. Monitor and supplement as necessary.  Thrombocytopenia (HCC) Platelets have improved from 101 to 110 today. Platelets were 70 on admission. Bilirubin is declining along with his liver functions . T bili is now 1.4 down from 2.1.   Transaminitis Due to alcoholic hepatitis. AST, ALT, are slowly coming down. Continue to monitor.       Subjective: The patient is resting quietly in bed. No new complaints.   Physical Exam: Vitals:   02/10/23 0332 02/10/23 0754 02/10/23 1100 02/10/23 1400  BP: 115/73 123/77 118/78 109/83  Pulse: 85 89 96 (!) 128  Resp: 18  17   Temp: 98.4 F (36.9 C)  98 F (36.7 C) 98.7 F (37.1 C)  TempSrc: Oral  Oral Oral  SpO2: 93%  95% 98%  Weight:      Height:      Exam:  Constitutional:  The patient is awake, alert, and oriented x 3. Mild distress and tremors. ENMT:  Positive tongue fasciculations, improved. Respiratory:  No increased work of breathing. No wheezes, rales, or rhonchi No tactile fremitus Cardiovascular:  Regular rate and rhythm No murmurs, ectopy, or gallups.  No lateral PMI. No thrills. Abdomen:  Abdomen is soft, non-tender, non-distended No hernias, masses, or organomegaly Normoactive bowel sounds.  Musculoskeletal:  No cyanosis, clubbing, or edema Skin:  No rashes, lesions, ulcers palpation of skin: no induration or nodules Neurologic:  CN 2-12 intact Sensation all 4 extremities intact Positive tremors and tongue fasciculations. Improving. Psychiatric:  Mental  status Anxious Orientation to person, place, time  judgment and insight appear intact  Data Reviewed:  CMP, CBC  Family Communication: Discussed patient with father with patient's consent.  Disposition: Status is: Inpatient Remains inpatient appropriate because: Pt is actively withdrawing.  Planned Discharge Destination: Home    Time spent: 38 minutes  Author: Egor Fullilove, DO 02/10/2023 4:18 PM  For on call review [email or web address].

## 2023-02-11 DIAGNOSIS — F10939 Alcohol use, unspecified with withdrawal, unspecified: Secondary | ICD-10-CM | POA: Diagnosis not present

## 2023-02-11 MED ORDER — CHLORDIAZEPOXIDE HCL 5 MG PO CAPS
10.0000 mg | ORAL_CAPSULE | Freq: Two times a day (BID) | ORAL | Status: DC
  Administered 2023-02-12: 10 mg via ORAL
  Filled 2023-02-11: qty 2

## 2023-02-11 MED ORDER — CHLORDIAZEPOXIDE HCL 5 MG PO CAPS
10.0000 mg | ORAL_CAPSULE | Freq: Three times a day (TID) | ORAL | Status: AC
  Administered 2023-02-11: 10 mg via ORAL
  Filled 2023-02-11: qty 2

## 2023-02-11 NOTE — Plan of Care (Signed)

## 2023-02-11 NOTE — Progress Notes (Signed)
Progress Note   Patient: Mark Vargas WJX:[id number] DOB: 06-27-1995 DOA: 02/06/2023     4 DOS: the patient was seen and examined on 02/11/2023   Brief hospital course: Patient is a 27 year old male with history of chronic alcohol abuse who presented to the emergency department for alcohol detoxification.  He consumes 15 white claws a day.  He wants to stop alcohol and was requesting for assistance and presented himself to [facility] but he was noticed to have alcohol withdrawal with tremulousness, tachycardia, anxiety and was sent to the emergency department.  On presentation, he was in sinus tachycardia,appeared in withdrawl.  Lab work showed potassium 3.4, elevated liver enzymes, alcohol level 40.  Patient was admitted for the management of alcohol withdrawal.   The patient's last drink was 02/06/2023 at 11;00 am. He has been admitted to a telemetry bed with Ativan by CIWA protocol. On the am of 02/08/2023 the patient stated that he was feeling better, although he continued to be quite tremulous. By the afternoon the patient's heart rate was sustaining in the 140-150 range. He was given a 1l bolus of IV NS and started on phenobarbital. He was also started on clonidine. It seems that the patient's symptoms are increasing as time from his last drink is growing. He was also started on phenobarbital 130 mg bid.   The patient has now been transitioned off of CIWA protocol and onto a librium taper. He is currently getting librium 10 mg tid. He continues to receive clonidine.  On 02/11/2023 the patient's tremors and tongue fasciculations are much improved, however the patient has stated that he is uncomfortably with leaving the hospital before tomorrow, because tomorrow he can go directly from the hospital to the rehab facility. I anticipate that he will discharge tomorrow.  Assessment and Plan: Alcohol withdrawal (HCC) The patient's last drink was 02/06/2023 at 11;00 am. He has been admitted to a  telemetry bed with Ativan by CIWA protocol. On the am of 02/08/2023 the patient stated that he was feeling better, although he continued to be quite tremulous. By the afternoon the patient's heart rate was sustaining in the 140-150 range. He was given a 1l bolus of IV NS and started on phenobarbital. He was also started on clonidine. It seems that the patient's symptoms are increasing as time from his last drink is growing. Scheduled Ativan has been replaced with scheduled librium. Will continue to wean.  Tremors and fasciculations are continuing to decrease again today. Anticipate discharge to rehab tomorrow.    Chronic alcohol abuse Patient wishes to seek help with alcohol rehab upon completion of his withdrawal.  He is receiving thiamine and folate.   High anion gap metabolic acidosis Alcoholic metabolic acidosis. Resolved.  Hypokalemia Resolved. Monitor and supplement as necessary.  Thrombocytopenia (HCC) Platelets have improved from 101 to 110 today. Platelets were 70 on admission. Bilirubin is declining along with his liver functions . T bili is now 1.4 down from 2.1.   Transaminitis Due to alcoholic hepatitis. AST, ALT, are slowly coming down. Continue to monitor.       Subjective: The patient is resting quietly in bed. No new complaints.   Physical Exam: Vitals:   02/11/23 0000 02/11/23 0348 02/11/23 0743 02/11/23 1210  BP: 125/72 134/84 128/71 (!) 133/96  Pulse: 85 89 93 (!) 102  Resp: 18 18    Temp: 98.9 F (37.2 C) 98.6 F (37 C) 98.4 F (36.9 C) 97.6 F (36.4 C)  TempSrc: Oral Oral Oral Oral  SpO2: 97% 97% 97% 98%  Weight:      Height:      Exam:  Constitutional:  The patient is awake, alert, and oriented x 3. He is in no acute distress. ENMT:  Positive tongue fasciculations, mild. Respiratory:  No increased work of breathing. No wheezes, rales, or rhonchi No tactile fremitus Cardiovascular:  Regular rate and rhythm No murmurs, ectopy, or gallups. No  lateral PMI. No thrills. Abdomen:  Abdomen is soft, non-tender, non-distended No hernias, masses, or organomegaly Normoactive bowel sounds.  Musculoskeletal:  No cyanosis, clubbing, or edema Skin:  No rashes, lesions, ulcers palpation of skin: no induration or nodules Neurologic:  CN 2-12 intact Sensation all 4 extremities intact Positive tremors and tongue fasciculations. Improving. Psychiatric:  Mental status Anxious Orientation to person, place, time  judgment and insight appear intact  Data Reviewed:  CMP, CBC  Family Communication: Girlfriend is at the patients bedside.  Disposition: Status is: Inpatient Remains inpatient appropriate because: Pt is actively withdrawing.  Planned Discharge Destination: Home  Time spent: 38 minutes  Author: Vallen Calabrese, DO 02/11/2023 3:36 PM  For on call review [email or web address].

## 2023-02-12 DIAGNOSIS — E876 Hypokalemia: Secondary | ICD-10-CM | POA: Diagnosis not present

## 2023-02-12 DIAGNOSIS — F1010 Alcohol abuse, uncomplicated: Secondary | ICD-10-CM | POA: Diagnosis not present

## 2023-02-12 DIAGNOSIS — D696 Thrombocytopenia, unspecified: Secondary | ICD-10-CM | POA: Diagnosis not present

## 2023-02-12 DIAGNOSIS — E8729 Other acidosis: Secondary | ICD-10-CM | POA: Diagnosis not present

## 2023-02-12 LAB — COMPREHENSIVE METABOLIC PANEL
ALT: 107 U/L — ABNORMAL HIGH (ref 0–44)
AST: 74 U/L — ABNORMAL HIGH (ref 15–41)
Albumin: 3.3 g/dL — ABNORMAL LOW (ref 3.5–5.0)
Alkaline Phosphatase: 76 U/L (ref 38–126)
Anion gap: 11 (ref 5–15)
BUN: 7 mg/dL (ref 6–20)
CO2: 19 mmol/L — ABNORMAL LOW (ref 22–32)
Calcium: 9.9 mg/dL (ref 8.9–10.3)
Chloride: 106 mmol/L (ref 98–111)
Creatinine, Ser: 0.66 mg/dL (ref 0.61–1.24)
GFR, Estimated: >60 mL/min (ref >60)
Glucose, Bld: 102 mg/dL — ABNORMAL HIGH (ref 70–99)
Potassium: 4.4 mmol/L (ref 3.5–5.1)
Sodium: 136 mmol/L (ref 135–145)
Total Bilirubin: 1.1 mg/dL (ref <1.2)
Total Protein: 7 g/dL (ref 6.5–8.1)

## 2023-02-12 LAB — CBC WITH DIFFERENTIAL/PLATELET
Abs Immature Granulocytes: 0.11 10*3/uL — ABNORMAL HIGH (ref 0.00–0.07)
Basophils Absolute: 0.1 10*3/uL (ref 0.0–0.1)
Basophils Relative: 1 %
Eosinophils Absolute: 0.4 10*3/uL (ref 0.0–0.5)
Eosinophils Relative: 5 %
HCT: 44.2 % (ref 39.0–52.0)
Hemoglobin: 15.2 g/dL (ref 13.0–17.0)
Immature Granulocytes: 1 %
Lymphocytes Relative: 16 %
Lymphs Abs: 1.2 10*3/uL (ref 0.7–4.0)
MCH: 31.7 pg (ref 26.0–34.0)
MCHC: 34.4 g/dL (ref 30.0–36.0)
MCV: 92.3 fL (ref 80.0–100.0)
Monocytes Absolute: 1.6 10*3/uL — ABNORMAL HIGH (ref 0.1–1.0)
Monocytes Relative: 21 %
Neutro Abs: 4.2 10*3/uL (ref 1.7–7.7)
Neutrophils Relative %: 56 %
Platelets: 202 10*3/uL (ref 150–400)
RBC: 4.79 MIL/uL (ref 4.22–5.81)
RDW: 12.2 % (ref 11.5–15.5)
WBC: 7.6 10*3/uL (ref 4.0–10.5)
nRBC: 0 % (ref 0.0–0.2)

## 2023-02-12 MED ORDER — FOLIC ACID 1 MG PO TABS: 1.0000 mg | ORAL_TABLET | Freq: Every day | ORAL | 1 refills | Status: AC

## 2023-02-12 MED ORDER — CHLORDIAZEPOXIDE HCL 10 MG PO CAPS: 10.0000 mg | ORAL_CAPSULE | Freq: Two times a day (BID) | ORAL | 0 refills | Status: AC

## 2023-02-12 MED ORDER — CHLORDIAZEPOXIDE HCL 10 MG PO CAPS: 10.0000 mg | ORAL_CAPSULE | Freq: Two times a day (BID) | ORAL | 0 refills | Status: DC

## 2023-02-12 MED ORDER — CLONIDINE HCL 0.1 MG PO TABS: 0.1000 mg | ORAL_TABLET | Freq: Two times a day (BID) | ORAL | 11 refills | Status: AC

## 2023-02-12 NOTE — Progress Notes (Signed)
IV removed needle intact. Patient discharged via wheelchair accompanied by volunteer and family.

## 2023-02-12 NOTE — TOC Transition Note (Signed)
Transition of Care [facility]) - CM/SW Discharge Note   Patient Details  Name: Mark Vargas MRN: [id number] Date of Birth: 1996-03-18  Transition of Care [facility]) CM/SW Contact:  Kermit Balo, RN Phone Number: 02/12/2023, 1:46 PM   Clinical Narrative:     Pt is discharging back to [facility] today. CM has faxed them the needed information to: [phone] Pts family is providing the needed transportation.  Final next level of care: Other (comment) ([facility]) Barriers to Discharge: No Barriers Identified   Patient Goals and CMS Choice      Discharge Placement                         Discharge Plan and Services Additional resources added to the After Visit Summary for     Discharge Planning Services: CM Consult                                 Social Determinants of Health (SDOH) Interventions SDOH Screenings   Food Insecurity: No Food Insecurity (02/07/2023)  Housing: Low Risk  (02/07/2023)  Transportation Needs: No Transportation Needs (02/07/2023)  Utilities: Not At Risk (02/07/2023)  Tobacco Use: Low Risk  (02/07/2023)     Readmission Risk Interventions     No data to display

## 2023-02-12 NOTE — Discharge Summary (Signed)
Physician Discharge Summary   Patient: Mark Vargas MRN: [id number] DOB: 03-09-1996  Admit date:     02/06/2023  Discharge date: 02/12/23  Discharge Physician: Arnetha Courser   PCP: Cheral Bay, MD   Recommendations at discharge:  Please obtain CBC and CMP on follow-up Follow-up with primary care provider within a week  Discharge Diagnoses: Principal Problem:   Alcohol withdrawal (HCC) Active Problems:   High anion gap metabolic acidosis   Transaminitis   Hypokalemia   Thrombocytopenia (HCC)   Chronic alcohol abuse   Hospital Course: Patient is a 27 year old male with history of chronic alcohol abuse who presented to the emergency department for alcohol detoxification. He consumes 15 white claws a day. He wants to stop alcohol and was requesting for assistance and presented himself to [facility] but he was noticed to have alcohol withdrawal with tremulousness, tachycardia, anxiety and was sent to the emergency department. On presentation, he was in sinus tachycardia,appeared in withdrawl. Lab work showed potassium 3.4, elevated liver enzymes, alcohol level 40. Patient was admitted for the management of alcohol withdrawal.   Patient was treated with Ativan by CIWA protocol and also received phenobarbital.  Was also started on clonidine. Scheduled Ativan was later switched with Librium with much improved withdrawal symptoms.  He was given clonidine and Librium on discharge.  Patient is being discharged to [facility] for further assistance to stay sober.  He should continue with thiamine and folic acid supplement while completing rehab program for alcohol cessation.  Patient was found to have high anion gap metabolic acidosis likely secondary to alcoholic ketoacidosis which was resolved before discharge.  He also had hypokalemia which was repleted.  He does have thrombocytopenia and transaminitis on admission which improved and stable.  Patient will continue on  current medications and need to have a close follow-up with his primary care provider for further assistance.  Consultants: None Procedures performed: None Disposition:  [facility] for alcohol cessation rehab Diet recommendation:  Discharge Diet Orders (From admission, onward)     Start     Ordered   02/12/23 0000  Diet - low sodium heart healthy        02/12/23 1301           Regular diet DISCHARGE MEDICATION: Allergies as of 02/12/2023       Reactions   Other Hives   Cats        Medication List     STOP taking these medications    diazePAM 10 MG/2ML Soaj   ibuprofen 600 MG tablet Commonly known as: ADVIL   triamcinolone cream 0.5 % Commonly known as: KENALOG       TAKE these medications    carbamazepine 200 MG tablet Commonly known as: TEGRETOL Take 100-200 mg by mouth See admin instructions. Take 200mg  by mouth twice daily for five days then 100mg  twice daily for 3 days then 100mg  once daily for three days.   chlordiazePOXIDE 10 MG capsule Commonly known as: LIBRIUM Take 1 capsule (10 mg total) by mouth 2 (two) times daily. What changed:  medication strength how much to take when to take this additional instructions   Clobetasol Propionate E 0.05 % emollient cream Generic drug: Clobetasol Prop Emollient Base Apply 1 Application topically 2 (two) times daily.   cloNIDine 0.1 MG tablet Commonly known as: CATAPRES Take 1 tablet (0.1 mg total) by mouth 2 (two) times daily.   folic acid 1 MG tablet Commonly known as: FOLVITE Take 1 tablet (1  mg total) by mouth daily. Start taking on: February 13, 2023   multivitamin capsule Take 1 capsule by mouth daily.   ondansetron 8 MG disintegrating tablet Commonly known as: ZOFRAN-ODT Take 8 mg by mouth every 8 (eight) hours as needed for nausea or vomiting. Max 2 tablets in 24 hours.   thiamine 100 MG tablet Commonly known as: VITAMIN B1 Take 100 mg by mouth daily.        Follow-up  Information     Cheral Bay, MD. Schedule an appointment as soon as possible for a visit in 1 week(s).   Specialty: Family Medicine Contact information: [address] [place] [address] [phone]                Discharge Exam: Ceasar Mons Weights   02/06/23 2316 02/12/23 0500  Weight: (!) 145.2 kg 98.1 kg   General.  Well-developed gentleman, in no acute distress. Pulmonary.  Lungs clear bilaterally, normal respiratory effort. CV.  Regular rate and rhythm, no JVD, rub or murmur. Abdomen.  Soft, nontender, nondistended, BS positive. CNS.  Alert and oriented .  No focal neurologic deficit. Extremities.  No edema, no cyanosis, pulses intact and symmetrical. Psychiatry.  Judgment and insight appears normal.   Condition at discharge: stable  The results of significant diagnostics from this hospitalization (including imaging, microbiology, ancillary and laboratory) are listed below for reference.   Imaging Studies: No results found.  Microbiology: No results found for this or any previous visit.  Labs: CBC: Recent Labs  Lab 02/07/23 0005 02/07/23 0851 02/08/23 0624 02/09/23 0931 02/10/23 0443 02/12/23 0536  WBC 4.5 4.8 5.1 8.4 7.4 7.6  NEUTROABS 3.2 3.4  --  5.8 4.9 4.2  HGB 15.0 14.3 14.4 15.2 14.3 15.2  HCT 43.0 42.0 42.6 45.2 41.8 44.2  MCV 90.1 92.1 91.0 92.4 91.7 92.3  PLT 70* 67* 72* 101* 110* 202   Basic Metabolic Panel: Recent Labs  Lab 02/07/23 0851 02/08/23 0624 02/09/23 0931 02/10/23 0443 02/12/23 0536  NA 136 134* 139 134* 136  K 3.5 3.5 3.9 3.5 4.4  CL 101 102 104 102 106  CO2 25 22 21* 23 19*  GLUCOSE 116* 98 94 103* 102*  BUN 5* <5* 8 7 7   CREATININE 0.80 0.71 0.81 0.71 0.66  CALCIUM 9.8 9.9 10.2 9.9 9.9  MG 1.7 1.7  --   --   --   PHOS 3.3  --   --   --   --    Liver Function Tests: Recent Labs  Lab 02/07/23 0851 02/08/23 0624 02/09/23 0931 02/10/23 0443 02/12/23 0536  AST 493* 329* 198* 103* 74*  ALT 298*  263* 223* 158* 107*  ALKPHOS 94 95 101 86 76  BILITOT 2.1* 2.3* 1.6* 1.4* 1.1  PROT 7.8 7.5 7.8 6.8 7.0  ALBUMIN 4.0 3.8 3.9 3.5 3.3*   CBG: No results for input(s): "GLUCAP" in the last 168 hours.  Discharge time spent: greater than 30 minutes.  This record has been created using Conservation officer, historic buildings. Errors have been sought and corrected,but may not always be located. Such creation errors do not reflect on the standard of care.   Signed: Arnetha Courser, MD [facility] 02/12/2023

## 2023-03-20 NOTE — ED Notes (Cosign Needed)
.  Critical Care  Performed by: Roxy Horseman, PA-C Authorized by: Roxy Horseman, PA-C   Critical care provider statement:    Critical care time (minutes):  34   Critical care was necessary to treat or prevent imminent or life-threatening deterioration of the following conditions:  Toxidrome   Critical care was time spent personally by me on the following activities:  Development of treatment plan with patient or surrogate, discussions with consultants, evaluation of patient's response to treatment, examination of patient, ordering and review of laboratory studies, ordering and review of radiographic studies, ordering and performing treatments and interventions, pulse oximetry, re-evaluation of patient's condition and review of old charts     Roxy Horseman, PA-C 03/20/23 0112
